# Patient Record
Sex: Female | Born: 1950 | Race: White | Hispanic: No | Marital: Single | Smoking: Never smoker
Health system: Southern US, Academic
[De-identification: ages and names within clinical notes are randomized; demographics above are authoritative.]

## PROBLEM LIST (undated history)

## (undated) DIAGNOSIS — E042 Nontoxic multinodular goiter: Secondary | ICD-10-CM

## (undated) DIAGNOSIS — I10 Essential (primary) hypertension: Secondary | ICD-10-CM

## (undated) HISTORY — DX: Essential (primary) hypertension: I10

## (undated) HISTORY — PX: HX BREAST BIOPSY: SHX20

## (undated) HISTORY — PX: PB SUPRACERV ABD HYSTERECTOMY: 58180

## (undated) HISTORY — DX: Nontoxic multinodular goiter: E04.2

---

## 1988-04-15 ENCOUNTER — Encounter (HOSPITAL_COMMUNITY): Payer: Self-pay | Admitting: Internal Medicine

## 1997-08-02 ENCOUNTER — Ambulatory Visit (INDEPENDENT_AMBULATORY_CARE_PROVIDER_SITE_OTHER): Payer: Self-pay

## 1997-09-08 ENCOUNTER — Ambulatory Visit (HOSPITAL_BASED_OUTPATIENT_CLINIC_OR_DEPARTMENT_OTHER): Payer: Self-pay

## 1997-09-21 ENCOUNTER — Ambulatory Visit (INDEPENDENT_AMBULATORY_CARE_PROVIDER_SITE_OTHER): Payer: Self-pay | Admitting: Infection Control

## 1997-09-26 ENCOUNTER — Ambulatory Visit (HOSPITAL_COMMUNITY): Payer: Self-pay

## 1997-10-05 ENCOUNTER — Inpatient Hospital Stay (HOSPITAL_COMMUNITY): Payer: Self-pay

## 1997-10-11 ENCOUNTER — Ambulatory Visit (INDEPENDENT_AMBULATORY_CARE_PROVIDER_SITE_OTHER): Payer: Self-pay

## 1997-11-17 ENCOUNTER — Ambulatory Visit (INDEPENDENT_AMBULATORY_CARE_PROVIDER_SITE_OTHER): Payer: Self-pay

## 1997-11-29 ENCOUNTER — Ambulatory Visit (INDEPENDENT_AMBULATORY_CARE_PROVIDER_SITE_OTHER): Payer: Self-pay | Admitting: Infection Control

## 1998-06-07 ENCOUNTER — Ambulatory Visit (INDEPENDENT_AMBULATORY_CARE_PROVIDER_SITE_OTHER): Payer: Self-pay | Admitting: Family Medicine

## 1998-09-14 ENCOUNTER — Ambulatory Visit (HOSPITAL_BASED_OUTPATIENT_CLINIC_OR_DEPARTMENT_OTHER): Payer: Self-pay | Admitting: Family Medicine

## 1998-10-27 ENCOUNTER — Ambulatory Visit (INDEPENDENT_AMBULATORY_CARE_PROVIDER_SITE_OTHER): Payer: Self-pay

## 1999-03-12 ENCOUNTER — Ambulatory Visit (INDEPENDENT_AMBULATORY_CARE_PROVIDER_SITE_OTHER): Payer: Self-pay

## 1999-03-21 ENCOUNTER — Ambulatory Visit (INDEPENDENT_AMBULATORY_CARE_PROVIDER_SITE_OTHER): Payer: Self-pay | Admitting: Infection Control

## 1999-03-23 ENCOUNTER — Ambulatory Visit (INDEPENDENT_AMBULATORY_CARE_PROVIDER_SITE_OTHER): Payer: Self-pay | Admitting: Infection Control

## 1999-09-17 ENCOUNTER — Ambulatory Visit (INDEPENDENT_AMBULATORY_CARE_PROVIDER_SITE_OTHER): Payer: Self-pay

## 1999-09-18 ENCOUNTER — Ambulatory Visit (INDEPENDENT_AMBULATORY_CARE_PROVIDER_SITE_OTHER): Payer: Self-pay | Admitting: Infection Control

## 1999-10-05 ENCOUNTER — Ambulatory Visit (HOSPITAL_BASED_OUTPATIENT_CLINIC_OR_DEPARTMENT_OTHER): Payer: Self-pay

## 1999-11-01 ENCOUNTER — Other Ambulatory Visit: Payer: Self-pay | Admitting: Infection Control

## 1999-11-01 ENCOUNTER — Ambulatory Visit (INDEPENDENT_AMBULATORY_CARE_PROVIDER_SITE_OTHER): Payer: Self-pay | Admitting: Infection Control

## 2000-02-15 ENCOUNTER — Ambulatory Visit (HOSPITAL_BASED_OUTPATIENT_CLINIC_OR_DEPARTMENT_OTHER): Payer: Self-pay

## 2000-03-18 ENCOUNTER — Ambulatory Visit (INDEPENDENT_AMBULATORY_CARE_PROVIDER_SITE_OTHER): Payer: Self-pay

## 2000-03-19 ENCOUNTER — Ambulatory Visit (INDEPENDENT_AMBULATORY_CARE_PROVIDER_SITE_OTHER): Payer: Self-pay | Admitting: Infection Control

## 2000-11-12 ENCOUNTER — Other Ambulatory Visit: Payer: Self-pay

## 2000-11-12 ENCOUNTER — Ambulatory Visit (HOSPITAL_BASED_OUTPATIENT_CLINIC_OR_DEPARTMENT_OTHER): Payer: Self-pay

## 2001-03-10 ENCOUNTER — Ambulatory Visit (INDEPENDENT_AMBULATORY_CARE_PROVIDER_SITE_OTHER): Payer: Self-pay | Admitting: Infection Control

## 2001-05-21 ENCOUNTER — Ambulatory Visit (INDEPENDENT_AMBULATORY_CARE_PROVIDER_SITE_OTHER): Payer: Self-pay | Admitting: Infection Control

## 2001-11-16 ENCOUNTER — Ambulatory Visit (HOSPITAL_BASED_OUTPATIENT_CLINIC_OR_DEPARTMENT_OTHER): Payer: Self-pay

## 2001-11-16 ENCOUNTER — Other Ambulatory Visit: Payer: Self-pay

## 2002-05-18 ENCOUNTER — Ambulatory Visit (INDEPENDENT_AMBULATORY_CARE_PROVIDER_SITE_OTHER): Payer: Self-pay | Admitting: Infection Control

## 2002-05-19 ENCOUNTER — Ambulatory Visit (INDEPENDENT_AMBULATORY_CARE_PROVIDER_SITE_OTHER): Payer: Self-pay | Admitting: Infection Control

## 2002-05-26 ENCOUNTER — Ambulatory Visit (INDEPENDENT_AMBULATORY_CARE_PROVIDER_SITE_OTHER): Payer: Self-pay | Admitting: Infection Control

## 2002-06-01 ENCOUNTER — Ambulatory Visit (INDEPENDENT_AMBULATORY_CARE_PROVIDER_SITE_OTHER): Payer: Self-pay | Admitting: Infection Control

## 2002-06-16 ENCOUNTER — Ambulatory Visit (INDEPENDENT_AMBULATORY_CARE_PROVIDER_SITE_OTHER): Payer: Self-pay | Admitting: Infection Control

## 2002-07-01 ENCOUNTER — Ambulatory Visit (INDEPENDENT_AMBULATORY_CARE_PROVIDER_SITE_OTHER): Payer: Self-pay | Admitting: Infection Control

## 2002-12-23 ENCOUNTER — Ambulatory Visit (INDEPENDENT_AMBULATORY_CARE_PROVIDER_SITE_OTHER): Payer: Self-pay | Admitting: Infection Control

## 2002-12-29 ENCOUNTER — Ambulatory Visit (INDEPENDENT_AMBULATORY_CARE_PROVIDER_SITE_OTHER): Payer: Self-pay | Admitting: Infection Control

## 2003-01-03 ENCOUNTER — Ambulatory Visit (INDEPENDENT_AMBULATORY_CARE_PROVIDER_SITE_OTHER): Payer: Self-pay | Admitting: Infection Control

## 2003-08-24 ENCOUNTER — Ambulatory Visit (INDEPENDENT_AMBULATORY_CARE_PROVIDER_SITE_OTHER): Payer: Self-pay | Admitting: Infection Control

## 2003-08-31 ENCOUNTER — Ambulatory Visit (INDEPENDENT_AMBULATORY_CARE_PROVIDER_SITE_OTHER): Payer: Self-pay | Admitting: Infection Control

## 2003-09-06 ENCOUNTER — Ambulatory Visit (INDEPENDENT_AMBULATORY_CARE_PROVIDER_SITE_OTHER): Payer: Self-pay | Admitting: Internal Medicine

## 2003-09-07 ENCOUNTER — Ambulatory Visit (HOSPITAL_COMMUNITY): Payer: Self-pay | Admitting: Nephrology

## 2003-10-03 ENCOUNTER — Ambulatory Visit (INDEPENDENT_AMBULATORY_CARE_PROVIDER_SITE_OTHER): Payer: Self-pay | Admitting: Internal Medicine

## 2004-01-24 ENCOUNTER — Ambulatory Visit (INDEPENDENT_AMBULATORY_CARE_PROVIDER_SITE_OTHER): Payer: Self-pay | Admitting: Internal Medicine

## 2004-04-11 ENCOUNTER — Ambulatory Visit (INDEPENDENT_AMBULATORY_CARE_PROVIDER_SITE_OTHER): Payer: Self-pay | Admitting: Infection Control

## 2005-02-20 ENCOUNTER — Ambulatory Visit (HOSPITAL_COMMUNITY): Payer: Self-pay

## 2005-04-16 ENCOUNTER — Ambulatory Visit (INDEPENDENT_AMBULATORY_CARE_PROVIDER_SITE_OTHER): Payer: Self-pay | Admitting: Infection Control

## 2005-04-17 ENCOUNTER — Ambulatory Visit (INDEPENDENT_AMBULATORY_CARE_PROVIDER_SITE_OTHER): Payer: Self-pay | Admitting: Infection Control

## 2006-02-25 ENCOUNTER — Ambulatory Visit (HOSPITAL_COMMUNITY): Payer: Self-pay | Admitting: Nuclear Medicine

## 2006-03-11 ENCOUNTER — Ambulatory Visit (HOSPITAL_COMMUNITY): Payer: Self-pay

## 2006-06-04 ENCOUNTER — Ambulatory Visit (INDEPENDENT_AMBULATORY_CARE_PROVIDER_SITE_OTHER): Payer: Self-pay | Admitting: Infection Control

## 2006-06-11 ENCOUNTER — Other Ambulatory Visit (INDEPENDENT_AMBULATORY_CARE_PROVIDER_SITE_OTHER): Payer: Self-pay | Admitting: Infection Control

## 2006-07-16 ENCOUNTER — Ambulatory Visit (HOSPITAL_COMMUNITY): Payer: Self-pay

## 2006-07-18 ENCOUNTER — Ambulatory Visit (HOSPITAL_COMMUNITY): Payer: Self-pay

## 2006-12-22 ENCOUNTER — Ambulatory Visit
Admission: RE | Admit: 2006-12-22 | Discharge: 2006-12-22 | Disposition: A | Discharge status: Home / Self Care | Payer: No Typology Code available for payment source | Attending: Infection Control | Admitting: Infection Control

## 2007-01-06 ENCOUNTER — Ambulatory Visit
Admission: RE | Admit: 2007-01-06 | Discharge: 2007-01-06 | Disposition: A | Discharge status: Home / Self Care | Payer: No Typology Code available for payment source | Attending: Infection Control | Admitting: Infection Control

## 2007-01-06 DIAGNOSIS — E559 Vitamin D deficiency, unspecified: Secondary | ICD-10-CM | POA: Insufficient documentation

## 2007-05-11 ENCOUNTER — Ambulatory Visit
Admission: RE | Admit: 2007-05-11 | Discharge: 2007-05-11 | Disposition: A | Discharge status: Home / Self Care | Payer: No Typology Code available for payment source | Attending: Infection Control | Admitting: Infection Control

## 2007-05-11 ENCOUNTER — Other Ambulatory Visit (HOSPITAL_COMMUNITY): Payer: Self-pay | Admitting: NURSE PRACTITIONER

## 2007-05-11 ENCOUNTER — Ambulatory Visit (INDEPENDENT_AMBULATORY_CARE_PROVIDER_SITE_OTHER): Payer: Self-pay | Admitting: Infection Control

## 2007-05-11 DIAGNOSIS — E568 Deficiency of other vitamins: Secondary | ICD-10-CM | POA: Insufficient documentation

## 2007-05-11 DIAGNOSIS — E039 Hypothyroidism, unspecified: Secondary | ICD-10-CM | POA: Insufficient documentation

## 2007-05-11 LAB — ELECTROLYTES
ANION GAP: 5 mmol/L (ref 5–16)
CARBON DIOXIDE: 30 mmol/L (ref 22–32)
CHLORIDE: 105 mmol/L (ref 96–111)
SODIUM: 140 mmol/L (ref 136–145)

## 2007-05-11 LAB — CALCIUM: CALCIUM: 9.6 mg/dL (ref 8.5–10.4)

## 2007-05-11 LAB — BUN
BUN/CREAT RATIO: 21 (ref 6–22)
BUN: 16 mg/dL (ref 6–20)

## 2007-05-11 LAB — CREATININE
CREATININE: 0.77 mg/dL (ref 0.49–1.10)
ESTIMATED GLOMERULAR FILTRATION RATE: >59 mL/min/{1.73_m2} (ref >59)

## 2007-05-11 LAB — PARATHYROID HORMONE (PTH): INTACT PTH: 79 pg/mL (ref 12–88)

## 2007-05-11 LAB — THYROID STIMULATING HORMONE (SENSITIVE TSH): TSH: 0.317 u[IU]/mL (ref 0.300–5.900)

## 2007-05-11 NOTE — Telephone Encounter (Signed)
Triage Queue message copied by Kelli Hope on Mon May 11, 2007 8:32 AM  ------   Message from: [place], [place]   Created: Mon May 11, 2007 7:36 AM    >> Lewayne Bunting May 11, 2007 7:36 am  Dr. Maisie Fus     The patient wants a lab slip left at front desk of med [address] for pick up. She would appreciate a call to let her know when this is completed.

## 2007-05-11 NOTE — Telephone Encounter (Signed)
Lab slip for tsh, intact pth, vit d 25oh, ca, phos at front desk, pt notified.

## 2007-05-15 NOTE — Progress Notes (Signed)
Quick Note:    Chart ordered for Dr Maisie Fus. Sjg ma  ______

## 2007-05-20 ENCOUNTER — Encounter (INDEPENDENT_AMBULATORY_CARE_PROVIDER_SITE_OTHER): Payer: No Typology Code available for payment source | Admitting: Infection Control

## 2007-05-22 NOTE — Progress Notes (Signed)
[facility] Department of Medicine  [address]  [place], [place] [address]      PROGRESS NOTE    PATIENT NAME: Penny Weber, Penny Weber  CHART NUMBER: [id number]  DATE OF BIRTH: 1950-07-22  DATE OF SERVICE: 05/20/2007    SUBJECTIVE:  The patient is a 57 year old woman here for a followup visit.  The patient is followed yearly for having a goiter.  The patient has never had any treatment done for her multinodular goiter.  We just follow it every year.  The patient has had thyroid levels checked, which have been always slightly low TSHs.  This time her TSH was 0.317.  The patient denies any symptoms of hyperthyroidism.  The patient overall says she is feeling well.    The patient also has vitamin D deficiency.  The patient currently takes one 50,000 units vitamin D capsule monthly.  The patient also had her vitamin D level checked, which was 21.  The patient also has a DEXA scan previously, which showed osteopenia.    OBJECTIVE:  The patient's height is 5 feet 2 inches.  Weight was 150.9 pounds.  Blood pressure was 140/87, pulse 68, and respirations 16.  General:  Well nourished, in no acute distress.  On neck exam:  The patient has a slightly larger left side of her thyroid gland.  Heart:  Regular rhythm.  Lungs:  Clear to auscultation.    ASSESSMENT:  [age]-year-old female with goiter and vitamin D deficiency.    PLAN:  1.  For the goiter, we will continue to keep watching it and check yearly TSH levels.  At this time, no further intervention is needed.  2.  For the patient's vitamin D deficiency, we will increase her vitamin D to two 50,000 unit capsules per month.  We will also have the patient check a vitamin D level, calcium, and albumin in three months' time, and we will also set the patient up for a DEXA scan.      Blair Dolphin, MD  Resident  [facility] Department of Medicine     See resident's note for details. I saw and evaluated the patient and agree with the resident's findings and plans as written  except as noted below.  Doreatha Martin, MD  Professor, Section of Endocrinology and [facility] Department of Medicine  .pgr    [id number]; D: 05/20/2007 13:08:65; T: 05/21/2007 07:31:49

## 2007-05-29 ENCOUNTER — Ambulatory Visit
Admission: RE | Admit: 2007-05-29 | Discharge: 2007-05-29 | Disposition: A | Discharge status: Home / Self Care | Payer: No Typology Code available for payment source | Attending: NURSE PRACTITIONER | Admitting: NURSE PRACTITIONER

## 2007-05-29 ENCOUNTER — Ambulatory Visit (HOSPITAL_BASED_OUTPATIENT_CLINIC_OR_DEPARTMENT_OTHER)
Admission: RE | Admit: 2007-05-29 | Discharge: 2007-05-29 | Disposition: A | Discharge status: Home / Self Care | Payer: No Typology Code available for payment source | Source: Ambulatory Visit | Attending: Infection Control | Admitting: Infection Control

## 2007-05-29 DIAGNOSIS — Z1231 Encounter for screening mammogram for malignant neoplasm of breast: Secondary | ICD-10-CM | POA: Insufficient documentation

## 2007-05-29 DIAGNOSIS — Z803 Family history of malignant neoplasm of breast: Secondary | ICD-10-CM | POA: Insufficient documentation

## 2007-09-25 ENCOUNTER — Ambulatory Visit
Admission: RE | Admit: 2007-09-25 | Discharge: 2007-09-25 | Disposition: A | Discharge status: Home / Self Care | Payer: No Typology Code available for payment source | Attending: Infection Control | Admitting: Infection Control

## 2007-09-25 DIAGNOSIS — E559 Vitamin D deficiency, unspecified: Secondary | ICD-10-CM | POA: Insufficient documentation

## 2007-09-25 LAB — THYROID STIMULATING HORMONE (SENSITIVE TSH): TSH: 0.406 u[IU]/mL (ref 0.300–5.900)

## 2007-09-25 LAB — T UPTAKE: THYROID UPTAKE: 37.2 % (ref 32.0–49.0)

## 2007-09-25 LAB — ALBUMIN: ALBUMIN: 3.9 g/dL (ref 3.5–4.8)

## 2007-09-25 LAB — CALCIUM: CALCIUM: 9.4 mg/dL (ref 8.5–10.4)

## 2007-10-02 LAB — VITAMIN D CALCITROL, 1,25 HYDROXYVITAMIN D: VITAMIN D, 1,25-DIHYDROXY: 47

## 2007-10-29 ENCOUNTER — Ambulatory Visit (INDEPENDENT_AMBULATORY_CARE_PROVIDER_SITE_OTHER): Payer: Self-pay | Admitting: Infection Control

## 2007-10-29 NOTE — Telephone Encounter (Signed)
Triage Queue message copied by Kelli Hope on Thu Oct 29, 2007 10:35 AM  ------   Message from: Venia Carbon   Created: Thu Oct 29, 2007 10:28 AM    >> Venia Carbon Thu Oct 29, 2007 10:28 am  DR [facility]  PT NEEDS A FORM SENT TO [employer]. IT MUST STATE THE PT'S CONDITION WAS PREEXISTING. AS OF June 30TH THE INSURANCE COMPANY HAS NOT RECEIVED THE INFORMATION. THIS IS FROM May 11, 2007. PLEASE SEND THIS TO [facility] [address] [place], [place] [address]

## 2007-11-02 NOTE — Telephone Encounter (Signed)
Forms were sent to release of information in med records.

## 2007-11-30 ENCOUNTER — Ambulatory Visit (INDEPENDENT_AMBULATORY_CARE_PROVIDER_SITE_OTHER): Payer: Self-pay | Admitting: Infection Control

## 2007-11-30 NOTE — Telephone Encounter (Signed)
Triage Queue message copied by Kelli Hope on Mon Nov 30, 2007 1:46 PM  ------   Message from: Venia Carbon   Created: Mon Nov 30, 2007 12:39 PM    >> Venia Carbon Mon Nov 30, 2007 12:39 pm  DR CHIDECKEL  PT NEEDS A FORM SENT TO HER INSURANCE CO STATING HER CONDITION WAS PREEXISTING SO THEY WILL PAY FOR LAB WORK. IT NEEDS TO BE DATED JAN 26TH. THIS IS FOR THYROID CONDITION. PLEASE FAX [employer] ATTENTION JANAINE REF # [phone]. FAX# [phone]

## 2007-12-04 NOTE — Telephone Encounter (Signed)
 Called [employer], spoke with Janaine, she will fax me the letter that needed for the patient.

## 2007-12-04 NOTE — Telephone Encounter (Signed)
Triage Queue message copied by Janifer Adie on Fri Dec 04, 2007 2:41 PM  ------   Message from: Gibson Ramp   Created: Fri Dec 04, 2007 1:51 PM    >> Gibson Ramp Fri Dec 04, 2007 1:51 pm  The pt called again, she is wanting to know if this can be taken care of today? She said that she is getting ready to be turned in to collections because of this.    >> Donnajean Lopes Nov 30, 2007 12:39 pm  DR CHIDECKEL  PT NEEDS A FORM SENT TO HER INSURANCE CO STATING HER CONDITION WAS PREEXISTING SO THEY WILL PAY FOR LAB WORK. IT NEEDS TO BE DATED JAN 26TH. THIS IS FOR THYROID CONDITION. PLEASE FAX [employer] ATTENTION JANAINE REF # [phone]. FAX# [phone]

## 2007-12-04 NOTE — Telephone Encounter (Signed)
 Forms completed and faxed to Janaine at [employer], pt notified.

## 2008-03-17 ENCOUNTER — Other Ambulatory Visit (HOSPITAL_COMMUNITY): Payer: Self-pay | Admitting: NURSE PRACTITIONER

## 2008-04-21 ENCOUNTER — Ambulatory Visit
Admission: RE | Admit: 2008-04-21 | Discharge: 2008-04-21 | Disposition: A | Discharge status: Home / Self Care | Payer: No Typology Code available for payment source | Attending: NURSE PRACTITIONER | Admitting: NURSE PRACTITIONER

## 2008-04-21 DIAGNOSIS — E559 Vitamin D deficiency, unspecified: Secondary | ICD-10-CM | POA: Insufficient documentation

## 2008-04-21 DIAGNOSIS — R55 Syncope and collapse: Secondary | ICD-10-CM | POA: Insufficient documentation

## 2008-04-21 DIAGNOSIS — I10 Essential (primary) hypertension: Secondary | ICD-10-CM | POA: Insufficient documentation

## 2008-04-21 LAB — COMPREHENSIVE METABOLIC PANEL, NON-FASTING
ALBUMIN: 4 g/dL (ref 3.5–4.8)
ALKALINE PHOSPHATASE: 104 U/L (ref 38–126)
ALT (SGPT): 20 U/L (ref 6–35)
ANION GAP: 6 mmol/L (ref 5–16)
AST (SGOT): 21 U/L (ref 5–30)
BILIRUBIN, TOTAL: 1 mg/dL (ref 0.3–1.3)
BUN/CREAT RATIO: 36 — ABNORMAL HIGH (ref 6–22)
BUN: 27 mg/dL — ABNORMAL HIGH (ref 6–20)
CALCIUM: 9.5 mg/dL (ref 8.5–10.4)
CARBON DIOXIDE: 29 mmol/L (ref 22–32)
CHLORIDE: 104 mmol/L (ref 96–111)
CREATININE: 0.75 mg/dL (ref 0.49–1.10)
ESTIMATED GLOMERULAR FILTRATION RATE: >59 ml/min/1.73m2 (ref >59)
GLUCOSE,NONFAST: 91 mg/dL (ref 65–139)
POTASSIUM: 4.5 mmol/L (ref 3.5–5.1)
SODIUM: 139 mmol/L (ref 136–145)
TOTAL PROTEIN: 6.8 g/dL (ref 6.4–8.3)

## 2008-04-21 LAB — CBC/DIFF
BASOPHILS: 1 % (ref 0–1)
BASOS ABS: 0.068 THOU/uL (ref 0.0–0.2)
EOS ABS: 0.212 THOU/uL (ref 0.1–0.3)
EOSINOPHIL: 3 % (ref 1–6)
HCT: 43.4 % (ref 33.5–45.2)
HGB: 14.8 g/dL (ref 11.5–15.2)
LYMPHOCYTES: 40 % (ref 20–45)
LYMPHS ABS: 2.62 THOU/uL (ref 1.0–4.8)
MCH: 30.1 pg (ref 27.4–33.0)
MCHC: 34.1 g/dL (ref 31.6–35.5)
MCV: 88.3 fL (ref 82.0–99.0)
MONOCYTES: 8 % (ref 4–13)
MONOS ABS: 0.541 THOU/uL (ref 0.1–0.9)
MPV: 7.5 FL (ref 7.4–10.4)
NRBC'S: 0 /100{WBCs}
PLATELET COUNT: 274 THO/UL (ref 140–450)
PMN ABS: 3.13 THOU/uL (ref 1.5–7.7)
PMN'S: 48 % (ref 40–75)
RBC: 4.91 MIL/uL (ref 3.84–5.04)
RDW: 11.4 % (ref 10.2–14.0)
WBC: 6.6 THOU/UL (ref 3.5–11.0)

## 2008-04-21 LAB — MAGNESIUM: MAGNESIUM: 2.3 mg/dL (ref 1.7–2.5)

## 2008-04-22 LAB — VITAMIN D 25 HYDROXY: 25-HYDROXY VITAMIN D: 16 — ABNORMAL LOW

## 2008-05-19 ENCOUNTER — Encounter (INDEPENDENT_AMBULATORY_CARE_PROVIDER_SITE_OTHER): Payer: No Typology Code available for payment source | Admitting: Infection Control

## 2008-05-26 ENCOUNTER — Encounter (INDEPENDENT_AMBULATORY_CARE_PROVIDER_SITE_OTHER): Payer: No Typology Code available for payment source | Admitting: Infection Control

## 2008-07-13 ENCOUNTER — Other Ambulatory Visit (HOSPITAL_COMMUNITY): Payer: Self-pay | Admitting: NURSE PRACTITIONER

## 2008-07-13 ENCOUNTER — Ambulatory Visit
Admission: RE | Admit: 2008-07-13 | Discharge: 2008-07-13 | Disposition: A | Discharge status: Home / Self Care | Payer: No Typology Code available for payment source | Attending: NURSE PRACTITIONER | Admitting: NURSE PRACTITIONER

## 2008-07-13 DIAGNOSIS — Z803 Family history of malignant neoplasm of breast: Secondary | ICD-10-CM | POA: Insufficient documentation

## 2008-07-13 DIAGNOSIS — Z1231 Encounter for screening mammogram for malignant neoplasm of breast: Secondary | ICD-10-CM | POA: Insufficient documentation

## 2008-07-19 ENCOUNTER — Ambulatory Visit
Admission: RE | Admit: 2008-07-19 | Discharge: 2008-07-19 | Disposition: A | Discharge status: Home / Self Care | Payer: No Typology Code available for payment source | Attending: NURSE PRACTITIONER | Admitting: NURSE PRACTITIONER

## 2008-07-19 DIAGNOSIS — Z853 Personal history of malignant neoplasm of breast: Secondary | ICD-10-CM | POA: Insufficient documentation

## 2008-08-18 ENCOUNTER — Ambulatory Visit
Admission: RE | Admit: 2008-08-18 | Discharge: 2008-08-18 | Disposition: A | Discharge status: Home / Self Care | Payer: No Typology Code available for payment source | Attending: Infection Control | Admitting: Infection Control

## 2008-08-18 ENCOUNTER — Ambulatory Visit (INDEPENDENT_AMBULATORY_CARE_PROVIDER_SITE_OTHER): Payer: No Typology Code available for payment source | Admitting: Infection Control

## 2008-08-18 DIAGNOSIS — E039 Hypothyroidism, unspecified: Secondary | ICD-10-CM | POA: Insufficient documentation

## 2008-08-18 DIAGNOSIS — E559 Vitamin D deficiency, unspecified: Secondary | ICD-10-CM | POA: Insufficient documentation

## 2008-08-18 LAB — THYROID STIMULATING HORMONE (SENSITIVE TSH): TSH: 0.285 u[IU]/mL — ABNORMAL LOW (ref 0.300–5.900)

## 2008-08-19 ENCOUNTER — Telehealth (INDEPENDENT_AMBULATORY_CARE_PROVIDER_SITE_OTHER): Payer: Self-pay | Admitting: Infection Control

## 2008-08-19 LAB — VITAMIN D 25 HYDROXY: 25-HYDROXY VITAMIN D: 58

## 2008-08-19 NOTE — Telephone Encounter (Signed)
I left a message for the patient to return my call.  Labs ordered.

## 2008-08-22 NOTE — Telephone Encounter (Signed)
Pt notified of labs, and advised to repeat labs in 6 months, sooner if she feels symptoms of hyperthyroidism. Lab order placed.

## 2009-05-17 ENCOUNTER — Ambulatory Visit
Admission: RE | Admit: 2009-05-17 | Discharge: 2009-05-17 | Disposition: A | Discharge status: Home / Self Care | Payer: No Typology Code available for payment source | Attending: Infection Control | Admitting: Infection Control

## 2009-05-17 DIAGNOSIS — E039 Hypothyroidism, unspecified: Secondary | ICD-10-CM | POA: Insufficient documentation

## 2009-05-17 LAB — THYROID STIMULATING HORMONE (SENSITIVE TSH): TSH: 0.367 u[IU]/mL (ref 0.300–5.900)

## 2009-05-29 ENCOUNTER — Other Ambulatory Visit (HOSPITAL_COMMUNITY): Payer: Self-pay | Admitting: NURSE PRACTITIONER

## 2009-08-17 ENCOUNTER — Encounter (INDEPENDENT_AMBULATORY_CARE_PROVIDER_SITE_OTHER): Payer: No Typology Code available for payment source | Admitting: Infection Control

## 2009-08-23 ENCOUNTER — Encounter (INDEPENDENT_AMBULATORY_CARE_PROVIDER_SITE_OTHER): Payer: No Typology Code available for payment source | Admitting: Infection Control

## 2009-09-06 ENCOUNTER — Ambulatory Visit
Admission: RE | Admit: 2009-09-06 | Discharge: 2009-09-06 | Disposition: A | Discharge status: Home / Self Care | Payer: No Typology Code available for payment source | Attending: Infection Control | Admitting: Infection Control

## 2009-09-06 ENCOUNTER — Ambulatory Visit (INDEPENDENT_AMBULATORY_CARE_PROVIDER_SITE_OTHER): Payer: No Typology Code available for payment source | Admitting: Infection Control

## 2009-09-06 DIAGNOSIS — E039 Hypothyroidism, unspecified: Secondary | ICD-10-CM | POA: Insufficient documentation

## 2009-09-06 DIAGNOSIS — M899 Disorder of bone, unspecified: Secondary | ICD-10-CM | POA: Insufficient documentation

## 2009-09-06 LAB — THYROID STIMULATING HORMONE (SENSITIVE TSH): TSH: 0.341 u[IU]/mL (ref 0.300–5.900)

## 2009-09-08 ENCOUNTER — Ambulatory Visit (HOSPITAL_BASED_OUTPATIENT_CLINIC_OR_DEPARTMENT_OTHER)
Admission: RE | Admit: 2009-09-08 | Discharge: 2009-09-08 | Disposition: A | Discharge status: Home / Self Care | Payer: No Typology Code available for payment source | Source: Ambulatory Visit | Attending: NURSE PRACTITIONER | Admitting: NURSE PRACTITIONER

## 2009-09-08 ENCOUNTER — Ambulatory Visit
Admission: RE | Admit: 2009-09-08 | Discharge: 2009-09-08 | Disposition: A | Discharge status: Home / Self Care | Payer: No Typology Code available for payment source | Attending: NURSE PRACTITIONER | Admitting: NURSE PRACTITIONER

## 2009-09-08 DIAGNOSIS — Z1231 Encounter for screening mammogram for malignant neoplasm of breast: Secondary | ICD-10-CM | POA: Insufficient documentation

## 2009-09-08 DIAGNOSIS — M899 Disorder of bone, unspecified: Secondary | ICD-10-CM | POA: Insufficient documentation

## 2009-09-08 LAB — VITAMIN D, SERUM (25 HYDROXYVITAMIN D2 AND D3 BY MS)
25 HYDROXYVITAMIN D2/D3,total: 45.6 ng/mL
25 HYDROXYVITAMIN D2: 3 ng/mL
25 HYDROXYVITAMIN D3: 42.6 ng/mL

## 2009-09-12 NOTE — Progress Notes (Signed)
[facility] Department of Medicine  [address]  [place], [place] [address]      PROGRESS NOTE    PATIENT NAME: Penny Weber, ZAFFINO [facility]  CHART NUMBER: [id number]  DATE OF BIRTH: 10/09/1950  DATE OF SERVICE: 09/06/2009    SUBJECTIVE: The patient is in excellent spirits, as usual.    OBJECTIVE: Blood pressure 120/74, pulse 55, weight 152. She is 5 feet 2-1/2 inches tall. Examination of the neck reveals a 25-gram diffuse goiter. No distinct nodules are felt. No lymph nodes are felt. The patient is presently taking 50,000 units of vitamin D a month.    ASSESSMENT:  1. Multinodular goiter, previously euthyroid.  2. Osteopenia on a DEXA scan.  3. Vitamin D deficiency, now being replaced on 50,000 units a month of vitamin D.    DISPOSITION: We got a TSH today. We got a 25-hydroxy vitamin D today to make sure she is in the right range of treatment. We kept her on the same dose of vitamin D. We will see her back in a year.      Doreatha Martin, MD  Professor, Section of Endocrinology and [facility] Department of Medicine    [id number]; D: 09/06/2009 11:39:24; T: 09/06/2009 14:43:17

## 2010-08-29 ENCOUNTER — Ambulatory Visit
Admission: RE | Admit: 2010-08-29 | Discharge: 2010-08-29 | Disposition: A | Discharge status: Home / Self Care | Payer: No Typology Code available for payment source | Source: Ambulatory Visit | Attending: NURSE PRACTITIONER | Admitting: NURSE PRACTITIONER

## 2010-08-29 DIAGNOSIS — I10 Essential (primary) hypertension: Secondary | ICD-10-CM | POA: Insufficient documentation

## 2010-08-29 DIAGNOSIS — E039 Hypothyroidism, unspecified: Secondary | ICD-10-CM | POA: Insufficient documentation

## 2010-08-29 LAB — COMPREHENSIVE METABOLIC PANEL, NON-FASTING
ALBUMIN: 4.1 g/dL (ref 3.5–4.8)
ALKALINE PHOSPHATASE: 122 U/L (ref 38–126)
ALT (SGPT): 21 U/L (ref 7–45)
ANION GAP: 5 mmol/L (ref 5–16)
AST (SGOT): 29 U/L (ref 8–41)
BILIRUBIN, TOTAL: 0.9 mg/dL (ref 0.3–1.3)
BUN/CREAT RATIO: 20 (ref 6–22)
BUN: 15 mg/dL (ref 6–20)
CALCIUM: 9.8 mg/dL (ref 8.5–10.4)
CARBON DIOXIDE: 30 mmol/L (ref 22–32)
CHLORIDE: 104 mmol/L (ref 96–111)
CREATININE: 0.74 mg/dL (ref 0.49–1.10)
ESTIMATED GLOMERULAR FILTRATION RATE: >59 ml/min/1.73m2 (ref >59)
GLUCOSE,NONFAST: 94 mg/dL (ref 65–139)
POTASSIUM: 5.4 mmol/L — ABNORMAL HIGH (ref 3.5–5.1)
SODIUM: 139 mmol/L (ref 136–145)
TOTAL PROTEIN: 7.2 g/dL (ref 6.4–8.3)

## 2010-08-29 LAB — LIPID PANEL
CHOLESTEROL: 189 mg/dL (ref <200)
HDL-CHOLESTEROL: 66 mg/dL (ref >39)
LDL (CALCULATED): 109 mg/dL — ABNORMAL HIGH (ref <100)
NON - HDL (CALCULATED): 123 mg/dL (ref <190)
TRIGLYCERIDES: 70 mg/dL (ref <150)
VLDL (CALCULATED): 14 mg/dL (ref <30)

## 2010-08-29 LAB — CBC/DIFF
BASOPHILS: 1 % (ref 0–1)
BASOS ABS: 0.058 THOU/uL (ref 0.0–0.2)
EOS ABS: 0.237 THOU/uL (ref 0.1–0.3)
EOSINOPHIL: 4 % (ref 1–6)
HCT: 45.2 % (ref 33.5–45.2)
HGB: 14.7 g/dL (ref 11.5–15.2)
LYMPHOCYTES: 38 % (ref 20–45)
LYMPHS ABS: 2.22 THOU/uL (ref 1.0–4.8)
MCH: 29.4 pg (ref 27.4–33.0)
MCHC: 32.6 g/dL (ref 31.6–35.5)
MCV: 90.2 fL (ref 82.0–99.0)
MONOCYTES: 9 % (ref 4–13)
MONOS ABS: 0.55 THOU/uL (ref 0.1–0.9)
MPV: 8.2 FL (ref 7.4–10.4)
NRBC'S: 0 /100{WBCs}
PLATELET COUNT: 233 THOU/uL (ref 140–450)
PMN ABS: 2.75 THOU/uL (ref 1.5–7.7)
PMN'S: 48 % (ref 40–75)
RBC: 5.01 MIL/uL (ref 3.84–5.04)
RDW: 12.1 % (ref 10.2–14.0)
WBC: 5.8 THOU/uL (ref 3.5–11.0)

## 2010-08-29 LAB — THYROID STIMULATING HORMONE (SENSITIVE TSH): TSH: 0.635 u[IU]/mL (ref 0.300–5.900)

## 2010-08-31 LAB — VITAMIN D, SERUM (25 HYDROXYVITAMIN D2 AND D3 BY MS)
25 HYDROXYVITAMIN D2/D3,total: 50.2 ng/mL
25 HYDROXYVITAMIN D2: 2.1 ng/mL
25 HYDROXYVITAMIN D3: 48.1 ng/mL

## 2010-09-05 ENCOUNTER — Encounter (INDEPENDENT_AMBULATORY_CARE_PROVIDER_SITE_OTHER): Payer: Self-pay | Admitting: Infection Control

## 2010-09-05 ENCOUNTER — Ambulatory Visit (INDEPENDENT_AMBULATORY_CARE_PROVIDER_SITE_OTHER): Payer: No Typology Code available for payment source | Admitting: Infection Control

## 2010-09-05 ENCOUNTER — Ambulatory Visit
Admission: RE | Admit: 2010-09-05 | Discharge: 2010-09-05 | Disposition: A | Discharge status: Home / Self Care | Payer: No Typology Code available for payment source | Source: Ambulatory Visit | Attending: Infection Control | Admitting: Infection Control

## 2010-09-05 VITALS — BP 113/76 | HR 61 | Resp 16 | Ht 62.0 in | Wt 122.0 lb

## 2010-09-05 DIAGNOSIS — E042 Nontoxic multinodular goiter: Secondary | ICD-10-CM | POA: Insufficient documentation

## 2010-09-05 DIAGNOSIS — M899 Disorder of bone, unspecified: Secondary | ICD-10-CM | POA: Insufficient documentation

## 2010-09-05 DIAGNOSIS — E876 Hypokalemia: Secondary | ICD-10-CM | POA: Insufficient documentation

## 2010-09-05 LAB — POTASSIUM: POTASSIUM: 4.1 mmol/L (ref 3.5–5.1)

## 2010-09-05 LAB — THYROID STIMULATING HORMONE (SENSITIVE TSH): TSH: 0.334 u[IU]/mL (ref 0.300–5.900)

## 2010-09-06 NOTE — Progress Notes (Signed)
[facility] Department of Medicine  [address]  [place], [place] [address]      PROGRESS NOTE    PATIENT NAME: Penny Weber, Penny Weber [facility]  CHART NUMBER: [id number]  DATE OF BIRTH: [birth year]  DATE OF SERVICE: 09/05/2010    SUBJECTIVE:  The patient returned to clinic today in her usual excellent spirits.    OBJECTIVE:  Blood pressure is 113/76, pulse is 61, weight 122, height 5 feet 2 inches.  Examination of the neck reveals a nodular feeling 20-25 gram goiter with no distinct nodules palpable and no lymph nodes.  The patient is presently taking blood pressure medicine.  She is on no thyroid pills.  She takes vitamin D with calcium because there was a question of hypovitaminosis D in the past with some osteopenia.    Laboratory data on the patient, CBC within normal limits, potassium was up at 5.4, creatinine 0.74, LDL 109, HDL 66, TSH 0.635 and vitamin D is well within normal range of 50.    ASSESSMENT:  1.  Multinodular goiter with normal thyroid function.  2.  History of hypervitaminosis D and osteopenia, now normal vitamin D levels.  3.  Hypokalemia possibly a laboratory error.  4.  Somewhat elevated LDL cholesterol with risk factors of hypertension, a family history of early heart disease.    DISPOSITION:  1.  Regarding the hyperkalemia, we asked the patient repeat the potassium today just to make sure it is not real.  2.  Thyroid wise, we will see her back in a year with another TSH.  3.  Regarding the LDL 109 with the patient's (1) strong family history of early heart disease and (2) her hypertension, I think getting the LDL well under 100 would be a reasonable goal and I would probably start her on a statin.  I told the patient to ask Dr. Otho Bellows about this.  4.  She is to come back to clinic in one year.      Doreatha Martin, MD  Professor, Section of Endocrinology and Metabolism  [facility] Department of Medicine    [id number]; D: 09/05/2010 10:59:20; T: 09/06/2010 10:15:36    cc: Lubertha Sayres MD       [facility] [address] [place], [place] [address]    Addendum: repeat K+ = 4.1.

## 2010-09-07 ENCOUNTER — Encounter: Payer: Self-pay | Admitting: NURSE PRACTITIONER

## 2011-01-03 ENCOUNTER — Other Ambulatory Visit (HOSPITAL_COMMUNITY): Payer: Self-pay | Admitting: NURSE PRACTITIONER

## 2011-01-22 ENCOUNTER — Ambulatory Visit (HOSPITAL_COMMUNITY): Payer: Self-pay

## 2011-01-23 ENCOUNTER — Ambulatory Visit (HOSPITAL_BASED_OUTPATIENT_CLINIC_OR_DEPARTMENT_OTHER)
Admission: RE | Admit: 2011-01-23 | Discharge: 2011-01-23 | Disposition: A | Discharge status: Home / Self Care | Payer: No Typology Code available for payment source | Source: Ambulatory Visit | Attending: NURSE PRACTITIONER | Admitting: NURSE PRACTITIONER

## 2011-01-23 ENCOUNTER — Other Ambulatory Visit (HOSPITAL_COMMUNITY): Payer: Self-pay | Admitting: NURSE PRACTITIONER

## 2011-01-23 ENCOUNTER — Ambulatory Visit
Admission: RE | Admit: 2011-01-23 | Discharge: 2011-01-23 | Disposition: A | Discharge status: Home / Self Care | Payer: No Typology Code available for payment source | Source: Ambulatory Visit | Attending: NURSE PRACTITIONER | Admitting: NURSE PRACTITIONER

## 2011-01-23 DIAGNOSIS — Z1231 Encounter for screening mammogram for malignant neoplasm of breast: Secondary | ICD-10-CM | POA: Insufficient documentation

## 2011-01-23 DIAGNOSIS — Z803 Family history of malignant neoplasm of breast: Secondary | ICD-10-CM | POA: Insufficient documentation

## 2011-09-09 ENCOUNTER — Encounter (INDEPENDENT_AMBULATORY_CARE_PROVIDER_SITE_OTHER): Payer: No Typology Code available for payment source | Admitting: "Endocrinology

## 2011-09-17 ENCOUNTER — Other Ambulatory Visit (HOSPITAL_COMMUNITY): Payer: Self-pay | Admitting: NURSE PRACTITIONER

## 2011-09-17 ENCOUNTER — Ambulatory Visit (INDEPENDENT_AMBULATORY_CARE_PROVIDER_SITE_OTHER): Payer: Self-pay | Admitting: "Endocrinology

## 2011-09-17 NOTE — Telephone Encounter (Signed)
Message copied by Paralee Cancel on Tue Sep 17, 2011 10:18 AM  ------       Message from: Celene Skeen D       Created: Tue Sep 17, 2011 10:15 AM       Regarding: lab orders         >> Princella Pellegrini [facility] 09/17/2011 10:15 AM       Dr. Sena Hitch,              Pt is requesting orders for blood work before her 10/04/11 appt

## 2011-09-17 NOTE — Telephone Encounter (Signed)
What labs do you want to order for patient?  Penny Weber [name], LPN 04/20/1094, 04:54 AM

## 2011-09-18 ENCOUNTER — Encounter (INDEPENDENT_AMBULATORY_CARE_PROVIDER_SITE_OTHER): Payer: No Typology Code available for payment source | Admitting: "Endocrinology

## 2011-09-25 ENCOUNTER — Encounter (INDEPENDENT_AMBULATORY_CARE_PROVIDER_SITE_OTHER): Payer: No Typology Code available for payment source | Admitting: "Endocrinology

## 2011-09-30 ENCOUNTER — Ambulatory Visit
Admission: RE | Admit: 2011-09-30 | Discharge: 2011-09-30 | Disposition: A | Discharge status: Home / Self Care | Payer: Managed Care, Other (non HMO) | Source: Ambulatory Visit | Attending: "Endocrinology | Admitting: "Endocrinology

## 2011-09-30 DIAGNOSIS — E039 Hypothyroidism, unspecified: Secondary | ICD-10-CM | POA: Insufficient documentation

## 2011-09-30 LAB — THYROID STIMULATING HORMONE WITH FREE T4 REFLEX: THYROID STIMULATING HORMONE WITH FREE T4 REFLEX: 0.407 u[IU]/mL (ref 0.300–5.900)

## 2011-10-04 ENCOUNTER — Ambulatory Visit: Payer: Managed Care, Other (non HMO) | Attending: "Endocrinology | Admitting: "Endocrinology

## 2011-10-04 ENCOUNTER — Encounter (INDEPENDENT_AMBULATORY_CARE_PROVIDER_SITE_OTHER): Payer: Self-pay | Admitting: "Endocrinology

## 2011-10-04 VITALS — BP 142/77 | HR 50 | Ht 61.18 in | Wt 127.6 lb

## 2011-10-04 DIAGNOSIS — M899 Disorder of bone, unspecified: Secondary | ICD-10-CM | POA: Insufficient documentation

## 2011-10-04 DIAGNOSIS — E042 Nontoxic multinodular goiter: Secondary | ICD-10-CM | POA: Insufficient documentation

## 2011-10-04 DIAGNOSIS — I10 Essential (primary) hypertension: Secondary | ICD-10-CM | POA: Insufficient documentation

## 2011-10-04 NOTE — Progress Notes (Signed)
Endocrinology Progress Note:    Follow up Reason(s):    1. Thyroid nodule  2.  Multinodular goiter    Subjective:  Penny Weber is a pleasant 61 y.o. female here for evaluation and management of her thyroid nodule and multinodular goiter.  Justin has been a patient of Kellie Moor, CFNP who she follows regularly. She reports she was diagnosed with thyroid nodule many years in past and that she had FNA of the right thyroid nodule in 1999 that was negative for malignancy.  Most recent ultrasound in 2004 that showed a 2.1 cm thyroid nodule on right and bilaterally enlarged thyroid.  Patient was previously followed by Dr. Maisie Fus in the past.  She reports no recent ultrasounds.      The patient reports no compressive sensation in the neck.  There is no difficulty swallowing or any choking sensation.  No change in voice or any hoarseness.  No history of trauma to the neck. No history of radiation to the neck.  No nausea or vomiting present.    She is working in public health evaluating  substance abuse.      She reports no history of fractures or falls.  She is taking vitamin d supplements.  She reports that she does not have any loose rugs and exercises regularly doing Zumba classes.    Review of Systems:   Signs/Symptoms Yes No Comments   Hair/Skin/Nail Changes  x    Compressive Symptoms  x    Fatigue  x    Weakness   x    Edema  x    Weight Changes  x    Menstrual Irregularities  x hysterectomy   Bowel Changes  x    Heat/Cold Intolerance  x    Palpitations  x    Tremors  x    Neck Tenderness  x    Increased Neck Size  x    Diplopia  x    Scleral Injection  x    Proptosis      Dry Eyes  x      No chest pain.  No shortness of breath.  No headaches.  No lightheadedness or dizziness.  No GI complaints. No diarrhea or constipation.  No nausea or vomiting.  No fever or chills.  No polyuria or polydipsia.  No change in eating patterns.  No change in sleep patterns.       All other systems negative unless otherwise noted in HPI and problem list.         Past Medical History   Diagnosis Date   . Multinodular goiter    . Hypertension    osteopenia    Past Surgical History   Procedure Date   . Pb supracerv abd hysterectomy      No Known Allergies    Family History   Problem Relation Age of Onset   . Thyroid Disease Mother      hypothyroidism   . Thyroid Cancer       no thyroid cancer in family     History     Social History   . Marital Status: Single     Spouse Name: N/A     Number of Children: N/A   . Years of Education: N/A     Social History Main Topics   . Smoking status: Never Smoker    . Smokeless tobacco: Never Used   . Alcohol Use: Not on file   . Drug Use: No   .  Sexually Active: Not on file     Other Topics Concern   . Not on file     Social History Narrative    Works at [employer] center, where she is driving frequently.     Current Outpatient Prescriptions   Medication Sig   . BISOPROLOL FUMARATE (ZEBETA ORAL) Take by mouth           Objective:      Filed Vitals:    10/04/11 0856   BP: 142/77   Pulse: 50   Height: 1.554 m (5' 1.18")   Weight: 57.9 kg (127 lb 10.3 oz)     Body mass index is 23.98 kg/(m^2).    Appearance:  Well appearing. No acute distress.  Psych:  Alert, awake, oriented x 3.    Eyes: No periorbital edema.  Hair: Normal for age.  Skin: Healthy.  No dry skin .  Nails: Healthy. No onycholysis.    Thyroid: +thyromegaly bilaterally, no nodules palpable currently  Neck: No  adenopathy.  Bruits: No audible bruit over thyroid bed.  Heart: RRR, no MRG.  Lungs: CTA.  Extremities: No tremors, palmar erythema, edema.  Reflexes: Normal.  No delayed relaxation or hyperreflexia.     Results for TAMRA, KOOS ANN (MRN [id number]) as of 10/04/2011 09:24   Ref. Range 09/30/2011 10:34   THYROID STIMULATING HORMONE WITH FREE T4 REFLEX Latest Range: 0.300-5.900 uIU/mL 0.407     Assessment/ Plan:   Penny Weber is a pleasant 62 y.o. female here for evaluation and management of thyroid nodule.      1. Thyroid nodules/ multinodular goiter  Clinically asymptomatic and euthyroid. Repeat Thyroid ultrasound to determine interval change in size of thyroid nodules.    Check TSH to determine if patient euthyroid before next visit.  Discussed with patient diagnosis of thyroid nodules and prognosis. Discussed function of thyroid and how thyroid affects metabolism and the signs/symptoms of hypothyroidism including fatigue, weight gain, edema, constipation, and depression.  Discussed with the patient that thyroid nodule may be benign growth but always risk of a cancer.  Discussed the risks and benefits of thyroid nodule fine needle aspiration.  Discussed benefits including early detection of possible cancer, establishing diagnosis, and determine what type of likely growth is present.  Discussed risks including bleeding, bruising, discomfort, and possibility of hitting neck structures.  Discussed with patient that FNA may be done under ultrasound guidance in ultrasound suite if thyroid nodule has increased in size.  Discussed need for possible surgery if the pathology results are positive for cancer or suspicious for cancer.  Patient verbalized understanding.      2.  Osteopenia  Repeat DEXA, last one done in 2011.  Currently on vitamin d supplementation.    3.  Hypertension  Patient prefers to follow up with primary care.      Discussed with patient to call in to [place] a week after lab tests and/or imaging are done if he/she has not heard from [place].  Answered all patient's questions.    Sherrine Maples, DO 10/04/2011   Assistant Professor  Section of Endocrinology and Metabolism  [facility], Department of Medicine

## 2011-11-08 ENCOUNTER — Other Ambulatory Visit (INDEPENDENT_AMBULATORY_CARE_PROVIDER_SITE_OTHER): Payer: Self-pay | Admitting: "Endocrinology

## 2011-11-08 ENCOUNTER — Ambulatory Visit
Admission: RE | Admit: 2011-11-08 | Discharge: 2011-11-08 | Disposition: A | Discharge status: Home / Self Care | Payer: Managed Care, Other (non HMO) | Source: Ambulatory Visit | Attending: "Endocrinology | Admitting: "Endocrinology

## 2011-11-08 ENCOUNTER — Ambulatory Visit (HOSPITAL_BASED_OUTPATIENT_CLINIC_OR_DEPARTMENT_OTHER)
Admission: RE | Admit: 2011-11-08 | Discharge: 2011-11-08 | Disposition: A | Discharge status: Home / Self Care | Payer: Managed Care, Other (non HMO) | Source: Ambulatory Visit | Attending: "Endocrinology | Admitting: "Endocrinology

## 2011-11-08 ENCOUNTER — Other Ambulatory Visit (INDEPENDENT_AMBULATORY_CARE_PROVIDER_SITE_OTHER): Payer: Self-pay

## 2011-11-08 DIAGNOSIS — M899 Disorder of bone, unspecified: Secondary | ICD-10-CM

## 2011-11-08 DIAGNOSIS — M949 Disorder of cartilage, unspecified: Secondary | ICD-10-CM

## 2011-11-08 DIAGNOSIS — E042 Nontoxic multinodular goiter: Secondary | ICD-10-CM | POA: Insufficient documentation

## 2011-11-11 ENCOUNTER — Other Ambulatory Visit (INDEPENDENT_AMBULATORY_CARE_PROVIDER_SITE_OTHER): Payer: Self-pay | Admitting: "Endocrinology

## 2011-11-11 NOTE — Progress Notes (Signed)
Quick Note:    Thyroid ultrasound results show:    No significant change in size.  Right mid lobe nodule 3.1 cm and not changed in size. Repeat ultrasound in 1 year.    ______

## 2011-11-11 NOTE — Progress Notes (Signed)
 Quick Note:    Patient has worsening of her osteopenia. She should be sure to eat enough calcium (1200mg  ) from the foods she eats and continue with vitamin d  supplementation. She is at risk for fractures in future but most recent FRAX score is 10.7% for major osteoportic fracture and 1.8% for hip fracture so no medical treatment indicated at this time.  ______

## 2011-11-12 ENCOUNTER — Telehealth (INDEPENDENT_AMBULATORY_CARE_PROVIDER_SITE_OTHER): Payer: Self-pay | Admitting: "Endocrinology

## 2011-11-12 NOTE — Telephone Encounter (Signed)
Left message for patient to call back into clinic  Penny Weber A Penny Harwick, LPN 04/17/7251, 66:44 AM

## 2011-11-12 NOTE — Telephone Encounter (Signed)
Message copied by Paralee Cancel on Tue Nov 12, 2011 11:01 AM  ------       Message from: Sherrine Maples       Created: Mon Nov 11, 2011  1:09 PM         Thyroid ultrasound results show:              No significant change in size.       Right mid lobe nodule 3.1 cm and not changed in size.  Repeat ultrasound in 1 year.

## 2011-11-12 NOTE — Telephone Encounter (Signed)
Message copied by Paralee Cancel on Tue Nov 12, 2011 11:01 AM  ------       Message from: Sherrine Maples       Created: Mon Nov 11, 2011  1:14 PM         Patient has worsening of her osteopenia.  She should be sure to eat enough calcium (1200mg  ) from the foods she eats and continue with vitamin d supplementation.  She is at risk for fractures in future but most recent FRAX score is 10.7% for major osteoportic fracture and 1.8% for hip fracture so no medical treatment indicated at this time.

## 2011-12-06 NOTE — Telephone Encounter (Signed)
Attempted to reach patient numerous times with no return phone call. Will mail patient a letter to inform her to call into clinic for her results  Paralee Cancel, LPN 1/61/0960, 45:40 AM

## 2012-04-24 ENCOUNTER — Ambulatory Visit
Admission: RE | Admit: 2012-04-24 | Discharge: 2012-04-24 | Disposition: A | Discharge status: Home / Self Care | Payer: Managed Care, Other (non HMO) | Source: Ambulatory Visit | Attending: NURSE PRACTITIONER | Admitting: NURSE PRACTITIONER

## 2012-04-24 DIAGNOSIS — Z1231 Encounter for screening mammogram for malignant neoplasm of breast: Secondary | ICD-10-CM | POA: Insufficient documentation

## 2012-11-09 ENCOUNTER — Other Ambulatory Visit (INDEPENDENT_AMBULATORY_CARE_PROVIDER_SITE_OTHER): Payer: Self-pay

## 2012-11-30 ENCOUNTER — Other Ambulatory Visit (INDEPENDENT_AMBULATORY_CARE_PROVIDER_SITE_OTHER): Payer: Self-pay

## 2012-12-16 ENCOUNTER — Ambulatory Visit
Admission: RE | Admit: 2012-12-16 | Discharge: 2012-12-16 | Disposition: A | Discharge status: Home / Self Care | Payer: BLUE CROSS/BLUE SHIELD | Source: Ambulatory Visit | Attending: "Endocrinology | Admitting: "Endocrinology

## 2012-12-16 DIAGNOSIS — E041 Nontoxic single thyroid nodule: Secondary | ICD-10-CM | POA: Insufficient documentation

## 2012-12-18 NOTE — Progress Notes (Signed)
 Quick Note:    No significant change in size of her thyroid  nodules. Will discuss further at next visit.  ______

## 2012-12-25 ENCOUNTER — Telehealth (INDEPENDENT_AMBULATORY_CARE_PROVIDER_SITE_OTHER): Payer: Self-pay | Admitting: "Endocrinology

## 2012-12-25 NOTE — Telephone Encounter (Signed)
Pt notified of results. Pt had no further questions.   Marguerita Beards, MA 12/25/2012, 12:11 PM

## 2012-12-25 NOTE — Telephone Encounter (Signed)
Message copied by Marguerita Beards on Fri Dec 25, 2012 12:09 PM  ------       Message from: [place], [place]       Created: Fri Dec 18, 2012  9:20 AM         No significant change in size of her thyroid nodules.  Will discuss further at next visit.  ------

## 2013-02-06 ENCOUNTER — Other Ambulatory Visit: Payer: Self-pay

## 2013-02-08 ENCOUNTER — Other Ambulatory Visit: Payer: Self-pay

## 2013-06-30 ENCOUNTER — Encounter (HOSPITAL_COMMUNITY): Payer: Self-pay

## 2014-01-14 ENCOUNTER — Other Ambulatory Visit: Payer: Self-pay

## 2015-02-04 ENCOUNTER — Other Ambulatory Visit: Payer: Self-pay

## 2015-02-07 ENCOUNTER — Other Ambulatory Visit (HOSPITAL_COMMUNITY): Payer: Self-pay | Admitting: NURSE PRACTITIONER

## 2015-02-07 DIAGNOSIS — Z1231 Encounter for screening mammogram for malignant neoplasm of breast: Secondary | ICD-10-CM

## 2016-01-27 ENCOUNTER — Other Ambulatory Visit: Payer: Self-pay

## 2016-05-14 ENCOUNTER — Ambulatory Visit: Payer: BLUE CROSS/BLUE SHIELD

## 2016-06-25 ENCOUNTER — Ambulatory Visit (HOSPITAL_BASED_OUTPATIENT_CLINIC_OR_DEPARTMENT_OTHER): Payer: Medicare Other

## 2016-06-25 ENCOUNTER — Encounter (HOSPITAL_BASED_OUTPATIENT_CLINIC_OR_DEPARTMENT_OTHER): Payer: Self-pay | Admitting: INTERNAL MEDICINE-ENDOCRINOLOGY-DIABETES AND METABOLISM

## 2016-06-25 ENCOUNTER — Ambulatory Visit
Payer: Medicare Other | Attending: INTERNAL MEDICINE-ENDOCRINOLOGY-DIABETES AND METABOLISM | Admitting: INTERNAL MEDICINE-ENDOCRINOLOGY-DIABETES AND METABOLISM

## 2016-06-25 VITALS — BP 162/75 | HR 54 | Ht 62.0 in | Wt 146.2 lb

## 2016-06-25 DIAGNOSIS — M8580 Other specified disorders of bone density and structure, unspecified site: Secondary | ICD-10-CM | POA: Insufficient documentation

## 2016-06-25 DIAGNOSIS — E0590 Thyrotoxicosis, unspecified without thyrotoxic crisis or storm: Secondary | ICD-10-CM

## 2016-06-25 DIAGNOSIS — I10 Essential (primary) hypertension: Secondary | ICD-10-CM

## 2016-06-25 DIAGNOSIS — Z90710 Acquired absence of both cervix and uterus: Secondary | ICD-10-CM | POA: Insufficient documentation

## 2016-06-25 DIAGNOSIS — M810 Age-related osteoporosis without current pathological fracture: Secondary | ICD-10-CM

## 2016-06-25 DIAGNOSIS — R251 Tremor, unspecified: Secondary | ICD-10-CM | POA: Insufficient documentation

## 2016-06-25 DIAGNOSIS — E0520 Thyrotoxicosis with toxic multinodular goiter without thyrotoxic crisis or storm: Secondary | ICD-10-CM | POA: Insufficient documentation

## 2016-06-25 DIAGNOSIS — E559 Vitamin D deficiency, unspecified: Secondary | ICD-10-CM | POA: Insufficient documentation

## 2016-06-25 DIAGNOSIS — Z79899 Other long term (current) drug therapy: Secondary | ICD-10-CM | POA: Insufficient documentation

## 2016-06-25 LAB — BASIC METABOLIC PANEL
ANION GAP: 8 mmol/L (ref 4–13)
BUN/CREA RATIO: 29 — ABNORMAL HIGH (ref 6–22)
BUN: 23 mg/dL (ref 8–25)
CALCIUM: 10.1 mg/dL (ref 8.5–10.2)
CHLORIDE: 105 mmol/L (ref 96–111)
CO2 TOTAL: 27 mmol/L (ref 22–32)
CREATININE: 0.78 mg/dL (ref 0.49–1.10)
ESTIMATED GFR: >59 mL/min/1.73mˆ2 (ref >59)
GLUCOSE: 94 mg/dL (ref 65–139)
POTASSIUM: 4.2 mmol/L (ref 3.5–5.1)
POTASSIUM: 4.2 mmol/L (ref 3.5–5.1)
SODIUM: 140 mmol/L (ref 136–145)

## 2016-06-25 LAB — T3 (TRIIODOTHYRONINE), FREE, SERUM: T3 FREE: 2.8 pg/mL (ref 1.7–3.7)

## 2016-06-25 LAB — THYROXINE, FREE (FREE T4): THYROXINE (T4), FREE: 0.96 ng/dL (ref 0.70–1.25)

## 2016-06-25 LAB — THYROID STIMULATING HORMONE (SENSITIVE TSH): TSH: 0.38 u[IU]/mL (ref 0.350–5.000)

## 2016-06-25 MED ORDER — HYDROCHLOROTHIAZIDE 25 MG TABLET
25.0000 mg | ORAL_TABLET | Freq: Every day | ORAL | 4 refills | Status: AC
Start: 2016-06-25 — End: ?

## 2016-06-25 NOTE — H&P (Signed)
[facility]  H&P     Date:06/25/2016   Historian: Patient and EHR   GENERAL DATA:   Patient Name: Penny Weber  Age: 66 y.o.  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 year old female who presents today for initial evaluation and management of toxic multi- nodular goiter previously treated with RAI therapy. Additional PMH to include Vitamin D deficiency, Low Bone Density,    From my review of EPIC, appears that she saw Dr. Maisie Fus back in 08/2008 for this.   Hx also includes FNA of right sided thyroid nodule in 1999 that was reported benign.     She has a hx of thyroid US back in 05/26/2002 reflecting:  Three thyroid nodules within the right lobe:  1) Solid nodule right upper lobe measuring 0.9 x 1.4 x 0.7 cm   2) Cystic nodule right lower lobe measuring 1 x 0.7 x 1 cm   3) Dominant nodule in right mid lobe 2.1 x 3.1 x 1.7 cm    Thyroid Uptake and Scan 2/18/20014:  24 hr RAI uptake 22.1% (within normal limits) which showed an asymmetrically enlarged right thyroid lobe w/a large area of increased radiotracer uptake. In addition, there is decreased uptake throughout the left thyroid lobe.     Repeat Thyroid US in 11/08/2011 compared to 1999/2004 showed :  Right Lobe:  1)Mid right lobe 3.1 x 2.1 x 1.7 cm, unchanged.   2) lower pole 9 x 6 x 8 mm   3) Upper pole solid 11 x 7 x 10 mm.   4) colloid cyst 5 mm   Left lobe:  3 tiny colloid cysts   There was no change in the size or appearance of the thyroid     Repeat Thyroid US 12/16/2012:  Right lobe:  1) mid to lower right lobe 3.1 x 1.4 x 1.8 cm  2) anterior mid right lobe 12.5 x 6.3 x 10 mm  3) lower pole 9 x 5 x 7.9 mm   Left lobe:  3 tiny colloid cysts     Per review of EPIC, appears her TSH has always been slightly at or below normal.     Denies neck symptoms today. No voice changes, neck  tenderness or pain, no dysphagia/odynophagia   Denies F/C/nausea/vomiting   Difficulty sleeping at night, no SOB, palpitations.   + occasional Tremors   5 lb weight gain over the past 6 months   No eye symptoms   Typically exercises daily doing Zumba     No bone fractures reported.   Last DEXA scan reportedly with low bone density s/p total hysterectomy for fibroid. Taking   1200 mg of calcium daily, 10, 000 VIt D daily, Two serving of calcium in diet      Mother had thyroid dx    MEDICATIONS :     Current Outpatient Prescriptions   Medication Sig   . BISOPROLOL FUMARATE (ZEBETA ORAL) Take by mouth     PAST MEDICAL HISTORY:     Past Medical History:   Diagnosis Date   . Hypertension    . Multinodular goiter        SURGICAL HISTORY:     Past Surgical History:   Procedure Laterality Date   . PB SUPRACERV ABD HYSTERECTOMY  ALLERGIES:   No Known Allergies     SOCIAL HISTORY:   Lives in [facility]   Single   Working for [employer]  18 years at [employer] for [employer]  Denies significant hx of smoking, ETOH, drugs     FAMILY HISTORY:     Family Medical History     Problem Relation (Age of Onset)    Bilateral breast cancer Sister    Breast Cancer Mother, Sister    Coronary Artery Disease Father    Hypertension Father    Stroke Paternal Grandmother    Thyroid Cancer Other    Thyroid Disease Mother        REVIEW OF SYSTEMS:   In addition to HPI...  Constitutional: negative  Eyes: negative  Ears, nose, mouth, throat, and face: negative  Respiratory: negative  Cardiovascular: negative  Gastrointestinal: negative  Genitourinary:negative  Integument/breast: negative  Hematologic/lymphatic: negative  Musculoskeletal:negative  Neurological: negative  Behavioral/Psych: negative  Endocrine: negative  Allergic/Immunologic: negative  All other ROS Negative    MENSTRUAL AND OBSTETRICAL HISTORY:   Total hysterectomy at the age of 66 y/o -   No children     PHYSICAL EXAM:   BP (!) 162/75  Pulse 54  Ht 1.575 m (5\' 2" )   Wt 66.3 kg (146 lb 2.6 oz)  BMI 26.73 kg/m2  General: appears stated age, no distress   Eyes:  pupils equal and round, conjunctiva clear, sclera non-icteric   HENT:  normocephalic   Neck: no adenopathy, thyroid not enlarged, symmetric, no tenderness/mass/nodules  Lungs: clear to auscultation bilaterally   Cardiovascular: regular rate and rhythm, no murmurs, radial pulses equal and regular bilaterally  Abdomen: soft, non-tender, non-distended, bowel sounds normoactive  Extremities: no cyanosis or edema  Skin: warm and dry  Neurologic: reflexes 2+ globally   Lymphatics: no submandibular, sunlingual, anterior cervical, or supraclavicular lymphadenopathy  Psychiatric:  normal affect, behavior, thought content, and speech.  IMAGING:   I have reviewed all Thyroid US since 2004 and the RAI scan    LABS:     Component      Latest Ref Rng & Units 09/30/2011 06/25/2016 06/25/2016          10:34 AM 10:58 AM 10:58 AM   SODIUM      136 - 145 mmol/L  140    POTASSIUM      3.5 - 5.1 mmol/L  4.2    CHLORIDE      96 - 111 mmol/L  105    CARBON DIOXIDE      22 - 32 mmol/L  27    ANION GAP      4 - 13 mmol/L  8    CALCIUM      8.5 - 10.2 mg/dL  16.1    GLUCOSE      65 - 139 mg/dL  94    BUN      8 - 25 mg/dL  23    CREATININE      0.49 - 1.10 mg/dL  0.96    BUN/CREAT RATIO      6 - 22  29 (H)    ESTIMATED GLOMERULAR FILTRATION RATE      >59 mL/min/1.17m^2  >59    THYROID STIMULATING HORMONE WITH FREE T4 REFLEX      0.300 - 5.900 uIU/mL 0.407     TSH      0.350 - 5.000 uIU/mL   0.380   THYROXINE, FREE (FREE T4)  0.70 - 1.25 ng/dL      FREE T3      1.7 - 3.7 pg/mL      THYROID STIMULATING IMMUNOGLOBULIN (TSI), SERUM      <0.1 IU/L      THYROPEROXIDASE (TPO) ANTIBODIES, SERUM      <=51 IU/mL        Component      Latest Ref Rng & Units 06/25/2016 06/25/2016 06/25/2016          10:58 AM 10:58 AM 10:58 AM   SODIUM      136 - 145 mmol/L      POTASSIUM      3.5 - 5.1 mmol/L      CHLORIDE      96 - 111 mmol/L      CARBON DIOXIDE      22 - 32  mmol/L      ANION GAP      4 - 13 mmol/L      CALCIUM      8.5 - 10.2 mg/dL      GLUCOSE      65 - 139 mg/dL      BUN      8 - 25 mg/dL      CREATININE      6.210.49 - 1.10 mg/dL      BUN/CREAT RATIO      6 - 22      ESTIMATED GLOMERULAR FILTRATION RATE      >59 mL/min/1.2534m^2      THYROID STIMULATING HORMONE WITH FREE T4 REFLEX      0.300 - 5.900 uIU/mL      TSH      0.350 - 5.000 uIU/mL      THYROXINE, FREE (FREE T4)      0.70 - 1.25 ng/dL 3.080.96     FREE T3      1.7 - 3.7 pg/mL  2.8    THYROID STIMULATING IMMUNOGLOBULIN (TSI), SERUM      <0.1 IU/L   <0.1   THYROPEROXIDASE (TPO) ANTIBODIES, SERUM      <=51 IU/mL        Component      Latest Ref Rng & Units 06/25/2016          10:58 AM   SODIUM      136 - 145 mmol/L    POTASSIUM      3.5 - 5.1 mmol/L    CHLORIDE      96 - 111 mmol/L    CARBON DIOXIDE      22 - 32 mmol/L    ANION GAP      4 - 13 mmol/L    CALCIUM      8.5 - 10.2 mg/dL    GLUCOSE      65 - 657139 mg/dL    BUN      8 - 25 mg/dL    CREATININE      8.460.49 - 1.10 mg/dL    BUN/CREAT RATIO      6 - 22    ESTIMATED GLOMERULAR FILTRATION RATE      >59 mL/min/1.2134m^2    THYROID STIMULATING HORMONE WITH FREE T4 REFLEX      0.300 - 5.900 uIU/mL    TSH      0.350 - 5.000 uIU/mL    THYROXINE, FREE (FREE T4)      0.70 - 1.25 ng/dL    FREE T3      1.7 - 3.7 pg/mL    THYROID STIMULATING  IMMUNOGLOBULIN (TSI), SERUM      <0.1 IU/L    THYROPEROXIDASE (TPO) ANTIBODIES, SERUM      <=51 IU/mL <10     ASSESSMENT:    Toxic Multi-Nodular Goiter, there are three nodules located within the right thyroid lobe and per RAI scan there are no cold defects hence all three nodules are hyperfunctioning/hot nodules. This is supported by a historically low TSH level. There is no indication for a biopsy of a hot/hyperfunctioning thyroid nodule. She however does report having a thyroid FNA back in 1999 which was -ve. I have no record of this for review. Thyroid ab all -ve. Will recheck TFT's today and a thyroid uptake and scan and after scan I have  discussed potentially treating her with repeat RAI vs anti-thyroid agents. Further therapy will be dependant of the results of the thyroid uptake and scan. She is mildly symptomatic with some tremors, difficulty sleeping and does have a hx of low bone density. The beta blocker she is currently on will help to control symptoms until further therapy with RAI vs anti-thyroid meds is performed.    Vit D Def, on supplementation.    Low Bone Density, taking calcium/vit D. Last DEXA 2013. Recommend f/u DEXA now.    HTN, uncontrolled today. Reportedly elevated at home as well. Will add HCTZ and ask her to f/u with Kellie Moor for further titration/addition of meds if needed      PLAN:   Labs/Imaging today:   Orders Placed This Encounter   . US THYROID   . NUC THYROID SCAN AND UPTAKE   . Tsh   . T4 Free   . T3 (TRIIODOTHYRONINE), FREE, SERUM   . THYROID STIMULATING IMMUNOGLOBULIN (TSI), SERUM   . THYROTROPIN RECEPTOR ANTIBODY, SERUM   . THYROPEROXIDASE (TPO) ANTIBODIES, SERUM   . Basic Metabolic Panel   . hydroCHLOROthiazide (HYDRODIURIL) 25 mg Oral Tablet       CC: All my notes to the patients physician, Kellie Moor, CFNP    F/u in: 3 mths, earlier if needed    I spent greater than 50% of a 50 minute visit in discussion of toxic multi-nodular goiter    Izetta Dakin, DO   Clinical Assistant Professor  Department of Endocrinology   06/25/2016  Pager: [phone]

## 2016-06-26 LAB — THYROID STIMULATING IMMUNOGLOBULIN (TSI), SERUM: THYROID STIMULATING IMMUNOGLOBULIN (TSI), SERUM: <0.1 IU/L (ref <0.1)

## 2016-06-26 LAB — THYROPEROXIDASE (TPO) ANTIBODIES, SERUM: ANTI THYROPEROXIDASE ANTIBODIES: <10 [IU]/mL (ref <=51)

## 2016-06-27 LAB — THYROTROPIN RECEPTOR ANTIBODY, SERUM: THYROTROPIN RECEPTOR ANTIBODY, SERUM: <1 IU/L (ref 0.00–1.75)

## 2016-09-23 ENCOUNTER — Ambulatory Visit
Admission: RE | Admit: 2016-09-23 | Discharge: 2016-09-23 | Disposition: A | Discharge status: Home / Self Care | Payer: Medicare Other | Source: Ambulatory Visit | Attending: INTERNAL MEDICINE-ENDOCRINOLOGY-DIABETES AND METABOLISM | Admitting: INTERNAL MEDICINE-ENDOCRINOLOGY-DIABETES AND METABOLISM

## 2016-09-23 DIAGNOSIS — E0590 Thyrotoxicosis, unspecified without thyrotoxic crisis or storm: Secondary | ICD-10-CM

## 2016-09-24 ENCOUNTER — Ambulatory Visit
Admission: RE | Admit: 2016-09-24 | Discharge: 2016-09-24 | Disposition: A | Discharge status: Still In Hospital | Payer: Medicare Other | Source: Ambulatory Visit | Attending: INTERNAL MEDICINE-ENDOCRINOLOGY-DIABETES AND METABOLISM | Admitting: INTERNAL MEDICINE-ENDOCRINOLOGY-DIABETES AND METABOLISM

## 2016-09-24 DIAGNOSIS — E0590 Thyrotoxicosis, unspecified without thyrotoxic crisis or storm: Secondary | ICD-10-CM | POA: Insufficient documentation

## 2016-09-25 ENCOUNTER — Encounter (HOSPITAL_BASED_OUTPATIENT_CLINIC_OR_DEPARTMENT_OTHER): Payer: Self-pay | Admitting: INTERNAL MEDICINE-ENDOCRINOLOGY-DIABETES AND METABOLISM

## 2016-09-25 ENCOUNTER — Ambulatory Visit (HOSPITAL_BASED_OUTPATIENT_CLINIC_OR_DEPARTMENT_OTHER): Payer: Medicare Other | Admitting: INTERNAL MEDICINE-ENDOCRINOLOGY-DIABETES AND METABOLISM

## 2016-09-25 ENCOUNTER — Ambulatory Visit
Admission: RE | Admit: 2016-09-25 | Discharge: 2016-09-25 | Disposition: A | Discharge status: Home / Self Care | Payer: Medicare Other | Source: Ambulatory Visit | Attending: INTERNAL MEDICINE-ENDOCRINOLOGY-DIABETES AND METABOLISM | Admitting: INTERNAL MEDICINE-ENDOCRINOLOGY-DIABETES AND METABOLISM

## 2016-09-25 ENCOUNTER — Ambulatory Visit (HOSPITAL_BASED_OUTPATIENT_CLINIC_OR_DEPARTMENT_OTHER): Payer: Medicare Other

## 2016-09-25 VITALS — BP 116/74 | HR 54 | Ht 62.01 in | Wt 145.3 lb

## 2016-09-25 DIAGNOSIS — E0520 Thyrotoxicosis with toxic multinodular goiter without thyrotoxic crisis or storm: Secondary | ICD-10-CM | POA: Insufficient documentation

## 2016-09-25 DIAGNOSIS — E559 Vitamin D deficiency, unspecified: Secondary | ICD-10-CM | POA: Insufficient documentation

## 2016-09-25 DIAGNOSIS — Z79899 Other long term (current) drug therapy: Secondary | ICD-10-CM | POA: Insufficient documentation

## 2016-09-25 DIAGNOSIS — E042 Nontoxic multinodular goiter: Secondary | ICD-10-CM

## 2016-09-25 DIAGNOSIS — E0590 Thyrotoxicosis, unspecified without thyrotoxic crisis or storm: Secondary | ICD-10-CM

## 2016-09-25 DIAGNOSIS — M8580 Other specified disorders of bone density and structure, unspecified site: Secondary | ICD-10-CM | POA: Insufficient documentation

## 2016-09-25 DIAGNOSIS — Z90710 Acquired absence of both cervix and uterus: Secondary | ICD-10-CM | POA: Insufficient documentation

## 2016-09-25 LAB — THYROXINE, FREE (FREE T4): THYROXINE (T4), FREE: 0.94 ng/dL (ref 0.70–1.25)

## 2016-09-25 LAB — THYROID STIMULATING HORMONE (SENSITIVE TSH): TSH: 0.542 u[IU]/mL (ref 0.350–5.000)

## 2016-09-25 LAB — T3 (TRIIODOTHYRONINE), FREE, SERUM: T3 FREE: 2.7 pg/mL (ref 1.7–3.7)

## 2016-09-29 NOTE — Progress Notes (Addendum)
Endocrinology Progress Note:    Follow up Reason(s):    1. Thyroid nodule  2.  Multinodular goiter  CC:   Toxic Multi-Nodular Goiter   HPI:   Penny Weber is a 66 y.o. year old female who presents today for initial evaluation and management of toxic multi- nodular goiter. Additional PMH to include Vitamin D deficiency, Low Bone Density,    From my review of EPIC, appears that she saw Dr. Maisie Fus back in 08/2008 for this.   Hx also includes FNA of right sided thyroid nodule in 1999 that was reported benign.     She has a hx of thyroid US back in 05/26/2002 reflecting:  Three thyroid nodules within the right lobe:  1) Solid nodule right upper lobe measuring 0.9 x 1.4 x 0.7 cm   2) Cystic nodule right lower lobe measuring 1 x 0.7 x 1 cm   3) Dominant nodule in right mid lobe 2.1 x 3.1 x 1.7 cm    Thyroid Uptake and Scan 2/18/20014:  24 hr RAI uptake 22.1% (within normal limits) which showed an asymmetrically enlarged right thyroid lobe w/a large area of increased radiotracer uptake. In addition, there is decreased uptake throughout the left thyroid lobe.     Repeat Thyroid US in 11/08/2011 compared to 1999/2004 showed :  Right Lobe:  1)Mid right lobe 3.1 x 2.1 x 1.7 cm, unchanged.   2) lower pole 9 x 6 x 8 mm   3) Upper pole solid 11 x 7 x 10 mm.   4) colloid cyst 5 mm   Left lobe:  3 tiny colloid cysts   There was no change in the size or appearance of the thyroid     Repeat Thyroid US 12/16/2012:  Right lobe:  1) mid to lower right lobe 3.1 x 1.4 x 1.8 cm  2) anterior mid right lobe 12.5 x 6.3 x 10 mm  3) lower pole 9 x 5 x 7.9 mm   Left lobe:  3 tiny colloid cysts     Thyroid Uptake and scan 09/24/16: Right hyperfunctioning thyroid adenoma. 24 hr RAIU 28.2%.     Thyroid US 09/25/16: Multiple nodules again identified throughout the R lobe with the largest at the posterior mid to lower right lobe measuring 23 mm and it was 30 mm. This appears to be the nodule that showed increased activity on the 2004 and 2018  nuclear medicine scans.     Per review of EPIC, appears her TSH has always been slightly at or below normal.     Denies neck symptoms today. No voice changes, neck tenderness or pain, no dysphagia/odynophagia   Denies F/C/nausea/vomiting   Difficulty sleeping at night, no SOB, palpitations.   + occasional Tremors   5 lb weight gain over the past 6 months   No eye symptoms   Typically exercises daily doing Zumba     No bone fractures reported.   Last DEXA scan reportedly with low bone density s/p total hysterectomy for fibroid. Taking   1200 mg of calcium daily, 10, 000 VIt D daily, Two serving of calcium in diet      Mother had thyroid dx      Review of Systems:   Signs/Symptoms Yes No Comments   Hair/Skin/Nail Changes  x    Compressive Symptoms  x    Fatigue  x    Weakness   x    Edema  x    Weight Changes  x  Menstrual Irregularities  x hysterectomy   Bowel Changes  x    Heat/Cold Intolerance  x    Palpitations  x    Tremors  x    Neck Tenderness  x    Increased Neck Size  x    Diplopia  x    Scleral Injection  x    Proptosis      Dry Eyes  x      No chest pain.  No shortness of breath.  No headaches.  No lightheadedness or dizziness.  No GI complaints. No diarrhea or constipation.  No nausea or vomiting.  No fever or chills.  No polyuria or polydipsia.  No change in eating patterns.  No change in sleep patterns.      All other systems negative unless otherwise noted in HPI and problem list.         Past Medical History   Diagnosis Date    Multinodular goiter     Hypertension    osteopenia    Past Surgical History:   Procedure Laterality Date    PB SUPRACERV ABD HYSTERECTOMY       No Known Allergies    Family History   Problem Relation Age of Onset    Thyroid Disease Mother      hypothyroidism    Thyroid Cancer       no thyroid cancer in family     Social History     Social History    Marital status: Single     Spouse name: N/A    Number of children: N/A    Years of education: N/A     Social History  Main Topics    Smoking status: Never Smoker    Smokeless tobacco: Never Used    Alcohol use Not on file    Drug use: No    Sexual activity: Not on file     Other Topics Concern    Not on file     Social History Narrative    Works at [employer], where she is driving frequently.     Current Outpatient Prescriptions   Medication Sig    BISOPROLOL FUMARATE (ZEBETA ORAL) Take by mouth    hydroCHLOROthiazide (HYDRODIURIL) 25 mg Oral Tablet Take 1 Tab (25 mg total) by mouth Once a day           Objective:      Vitals:    09/25/16 1250   BP: 116/74   Pulse: 54   Weight: 65.9 kg (145 lb 4.5 oz)   Height: 1.575 m (5' 2.01")     Body mass index is 26.57 kg/(m^2).    Appearance:  Well appearing. No acute distress.  Psych:  Alert, awake, oriented x 3.    Eyes: No periorbital edema.  Hair: Normal for age.  Skin: Healthy.  No dry skin .  Nails: Healthy. No onycholysis.    Thyroid: +thyromegaly bilaterally, no nodules palpable currently  Neck: No  adenopathy.  Bruits: No audible bruit over thyroid bed.  Heart: RRR, no MRG.  Lungs: CTA.  Extremities: No tremors, palmar erythema, edema.  Reflexes: Normal.  No delayed relaxation or hyperreflexia.     Lab Results   Component Value Date    TSH 0.542 09/25/2016    TSH 0.334 09/05/2010      Component      Latest Ref Rng & Units 06/25/2016 06/25/2016 06/25/2016          10:58 AM 10:58 AM 10:58  AM   THYROID STIMULATING IMMUNOGLOBULIN (TSI), SERUM      <0.1 IU/L <0.1     THYROTROPIN RECEPTOR ANTIBODY, SERUM      0.00 - 1.75 IU/L   <1.00   THYROPEROXIDASE (TPO) ANTIBODIES, SERUM      <=51 IU/mL  <10        IMAGING:    I have personally reviewed her Thyroid Uptake/scan from 09/24/16 and Thy [place] from 09/25/16 which show hot nodule (s) located within the R thyroid lobe.     Penny Weber  Female, 66 years old.  NUC THYROID SCAN AND UPTAKE performed on 09/24/2016 9:03 AM.  COMPARISON:  June 02, 2002.  INDICATION:  E05.90: Hyperthyroidism  TECHNIQUE:  Radionuclide: 280  microcuries of I-123    Approximately 24 hours after the oral administration of iodine-123, a  thyroid scan and radioactive iodine uptake (RAIU) calculation was  performed.  Routine images of the thyroid were obtained.    FINDINGS: There is again asymmetric enlargement of the right thyroid lobe.  There is a large nodule involving the mid to lower portion of the right  thyroid lobe which demonstrates increased tracer uptake, associated with  mild suppression of uptake within the left thyroid lobe. This was also  present on the prior study. The calculated 24-hour RAIU is 28.2%, which is  within normal limits (normal 10-30%).    IMPRESSION:  1.  Right hyperfunctioning thyroid adenoma.  2.  Normal RAIU.      US THYROID performed on 09/25/2016 10:31 AM.  REASON FOR EXAM:  E05.90: Hyperthyroidism  Ultrasound imaging of the thyroid is compared to the previous ultrasound of  December 16, 2012, and with the nuclear medicine I-123 scans of September 24, 2016 and June 02, 2002.    Today's examination shows the right lobe is 5.9 cm in length by 2.1 cm AP  by 2.3 cm transverse. Isthmus is 1.9 mm in thickness. The left lobe is 4.8  x 0.9 x 1.5 cm. The size of the thyroid is unchanged.    Multiple nodules are again identified throughout the right lobe similar to  the 2014 ultrasound.  An anterior upper lobe a solid nodule is 8.7 mm, and a small posterior  upper pole nodule is 5.4 mm.  A posterior upper pole nodule is 9 mm as seen on image 29.  In the anterior mid right lobe a nodule containing echogenic foci likely  calcifications is 12.1 x 7 x 11 mm, image 43, and is stable as it was 12.5  x 6.3 x 10 mm.  In the anterior lower pole a cyst nodule is 7.7 mm, image 56 and it was 9.9  mm.  In the posterior mid and lower right lobe a dominant nodule is 23.2 x 16.4  x 13.7 mm as seen on image 92. Previously it measured 30.7 x 13.8 x 17.8 mm  as seen on the prior study image 30.    The 2 prior nuclear medicine scan showed a  hyperactive nodule within the  mid to lower right lobe.    In the left lobe a tiny colloid cyst at the upper pole is 3 mm and a second  at the midportion is 2.2 mm. At the lower pole a similar colloid nodule is  3.2 mm.      IMPRESSION:  No change in the size nor appearance of the thyroid.    Multiple nodules are again identified throughout the right lobe as  discussed above with  the largest at the posterior mid to lower right lobe  measuring 23 mm and it was 30 mm. This appears to be the nodule that showed  increased activity on the 2004 and 2018 nuclear medicine scans. Please  correlate with the clinical data. If there are persistent concerns one  could consider fine-needle aspiration.    Assessment/ Plan:  Penny Weber is a pleasant 66 y.o. female here for evaluation and management of multiple thyroid nodules with R sided thyroid nodules with are hyperfunctioning..      1. Thyroid nodules/TN vs multinodular goiter  Clinically asymptomatic and euthyroid with borderline low TSH. Suspect a toxic adenoma / multinodular goiter with pronounced uptake R lobe with low normal TSH and have dicussed the treatments for this. Recommend tx due to hx of low bone density.     Discussed risks/benefits for MMI, RAI therapy vs surgery (not recommended). Patient wishes to proceed with RAI therapy.     Will check TFT's today and then every 1 month for 3 months following RAI therapy. Standing lab order placed for her today.    Largest nodules located within the left lobe are tiny colloid cysts 3 mm in largest dimension which do not meet criteria for biopsy.    2.  Osteopenia  Repeat DEXA, last one done in 2013.  Continue on vitamin d supplementation.  Have discussed that treating hyperthyroidism will be beneficial for bone disease     Discussed with patient to call in to [place] a week after lab tests and/or imaging are done if he/she has not heard from [place].  Answered all patient's questions.    Penny Dakin, DO   Assistant  Professor  Section of Endocrinology and Metabolism  [facility], Department of Medicine

## 2016-09-30 NOTE — Addendum Note (Signed)
Addended by: Izetta DakinGIORDANO, Joee Iovine on: 09/30/2016 07:38 AM     Modules accepted: Orders

## 2016-10-24 ENCOUNTER — Ambulatory Visit
Admission: RE | Admit: 2016-10-24 | Discharge: 2016-10-24 | Disposition: A | Discharge status: Home / Self Care | Payer: Medicare Other | Source: Ambulatory Visit | Attending: INTERNAL MEDICINE-ENDOCRINOLOGY-DIABETES AND METABOLISM | Admitting: INTERNAL MEDICINE-ENDOCRINOLOGY-DIABETES AND METABOLISM

## 2016-10-24 DIAGNOSIS — E0520 Thyrotoxicosis with toxic multinodular goiter without thyrotoxic crisis or storm: Secondary | ICD-10-CM | POA: Insufficient documentation

## 2016-10-29 ENCOUNTER — Telehealth (HOSPITAL_BASED_OUTPATIENT_CLINIC_OR_DEPARTMENT_OTHER): Payer: Self-pay | Admitting: INTERNAL MEDICINE-ENDOCRINOLOGY-DIABETES AND METABOLISM

## 2016-10-29 NOTE — Telephone Encounter (Signed)
-----   Message from Izetta DakinJennifer Giordano, DO sent at 10/28/2016 12:10 PM EDT -----  Dear Ms. Adelson,  Now that you have had radioactive therapy for your overactive thyroid, please get thyroid labs performed in 4 weeks time. The labs are already ordered in the system. Please call with questions or concerns. Sincerely, Dr. GReece Agar

## 2016-10-29 NOTE — Telephone Encounter (Signed)
Left message on Vm to return CLL to clinic. Kobe Ofallon, MA  10/29/2016, 16:07

## 2016-12-02 ENCOUNTER — Ambulatory Visit: Payer: Medicare Other | Attending: INTERNAL MEDICINE-ENDOCRINOLOGY-DIABETES AND METABOLISM

## 2016-12-02 DIAGNOSIS — E0520 Thyrotoxicosis with toxic multinodular goiter without thyrotoxic crisis or storm: Secondary | ICD-10-CM | POA: Insufficient documentation

## 2016-12-02 LAB — THYROXINE, FREE (FREE T4): THYROXINE (T4), FREE: 0.9 ng/dL (ref 0.70–1.25)

## 2016-12-02 LAB — THYROID STIMULATING HORMONE (SENSITIVE TSH): TSH: 1.385 u[IU]/mL (ref 0.350–5.000)

## 2016-12-30 ENCOUNTER — Encounter (HOSPITAL_BASED_OUTPATIENT_CLINIC_OR_DEPARTMENT_OTHER): Payer: Self-pay | Admitting: INTERNAL MEDICINE-ENDOCRINOLOGY-DIABETES AND METABOLISM

## 2017-01-07 ENCOUNTER — Ambulatory Visit (HOSPITAL_BASED_OUTPATIENT_CLINIC_OR_DEPARTMENT_OTHER): Payer: Medicare Other

## 2017-01-07 ENCOUNTER — Ambulatory Visit
Payer: Medicare Other | Attending: INTERNAL MEDICINE-ENDOCRINOLOGY-DIABETES AND METABOLISM | Admitting: INTERNAL MEDICINE-ENDOCRINOLOGY-DIABETES AND METABOLISM

## 2017-01-07 ENCOUNTER — Encounter (HOSPITAL_BASED_OUTPATIENT_CLINIC_OR_DEPARTMENT_OTHER): Payer: Self-pay | Admitting: INTERNAL MEDICINE-ENDOCRINOLOGY-DIABETES AND METABOLISM

## 2017-01-07 VITALS — BP 110/70 | HR 65 | Ht 62.0 in | Wt 145.7 lb

## 2017-01-07 DIAGNOSIS — E0520 Thyrotoxicosis with toxic multinodular goiter without thyrotoxic crisis or storm: Secondary | ICD-10-CM

## 2017-01-07 DIAGNOSIS — Z79899 Other long term (current) drug therapy: Secondary | ICD-10-CM | POA: Insufficient documentation

## 2017-01-07 DIAGNOSIS — M8580 Other specified disorders of bone density and structure, unspecified site: Secondary | ICD-10-CM | POA: Insufficient documentation

## 2017-01-07 DIAGNOSIS — E042 Nontoxic multinodular goiter: Secondary | ICD-10-CM

## 2017-01-07 DIAGNOSIS — E559 Vitamin D deficiency, unspecified: Secondary | ICD-10-CM | POA: Insufficient documentation

## 2017-01-07 DIAGNOSIS — Z8349 Family history of other endocrine, nutritional and metabolic diseases: Secondary | ICD-10-CM | POA: Insufficient documentation

## 2017-01-07 DIAGNOSIS — I10 Essential (primary) hypertension: Secondary | ICD-10-CM | POA: Insufficient documentation

## 2017-01-07 DIAGNOSIS — Z923 Personal history of irradiation: Secondary | ICD-10-CM | POA: Insufficient documentation

## 2017-01-07 LAB — THYROID STIMULATING HORMONE (SENSITIVE TSH): TSH: 14.598 u[IU]/mL — ABNORMAL HIGH (ref 0.350–5.000)

## 2017-01-07 LAB — THYROXINE, FREE (FREE T4): THYROXINE (T4), FREE: 0.83 ng/dL (ref 0.70–1.25)

## 2017-01-08 NOTE — Progress Notes (Signed)
Endocrinology Progress Note:    Follow up Reason(s):    1. Thyroid nodule  2.  Multinodular goiter  CC:   Toxic Multi-Nodular Goiter   HPI:   Tunisia Landgrebe is a 66 y.o.  female who presents today for f/u evaluation and management of toxic multi- nodular goiter. Additional PMH to include Vitamin D deficiency, Low Bone Density    From review of EPIC, appears that she saw Dr. Maisie Fus back in 08/2008 for this.   Hx also includes FNA of right sided thyroid nodule in 1999 that was reported benign.     She has a hx of thyroid US back in 05/26/2002 reflecting:  Three thyroid nodules within the right lobe:  1) Solid nodule right upper lobe measuring 0.9 x 1.4 x 0.7 cm   2) Cystic nodule right lower lobe measuring 1 x 0.7 x 1 cm   3) Dominant nodule in right mid lobe 2.1 x 3.1 x 1.7 cm    Thyroid Uptake and Scan 2/18/20014:  24 hr RAI uptake 22.1% (within normal limits) which showed an asymmetrically enlarged right thyroid lobe w/a large area of increased radiotracer uptake. In addition, there is decreased uptake throughout the left thyroid lobe.     Thyroid Uptake and scan 09/24/16: Right hyperfunctioning thyroid adenoma. 24 hr RAIU 28.2%.     Thyroid US 09/25/16: Multiple nodules again identified throughout the R lobe with the largest at the posterior mid to lower right lobe measuring 23 mm and it was 30 mm. This appears to be the nodule that showed increased activity on the 2004 and 2018 nuclear medicine scans.     Per review of EPIC, appears her TSH has always been slightly at or below normal.   On October 24, 2016 she was treated with radioactive iodine therapy 20.5 mCi  Lab work performed approximately 1 month after therapy reflected a TSH of 1.385 and a free T4 level of 0.90.   Labs were performed December 02, 2016.    Today she returns for follow-up  Denies neck symptoms today. No voice changes, neck tenderness or pain, no dysphagia/odynophagia   No dry mouth  Weight stable  Feels less anxious and more even keel  since radioactive iodine therapy    No bone fractures reported.   Last DEXA scan reportedly with low bone density s/p total hysterectomy for fibroid. Taking   1200 mg of calcium daily, 10, 000 VIt D daily, Two serving of calcium in diet      Mother had thyroid dx      Review of Systems:   Signs/Symptoms Yes No Comments   Hair/Skin/Nail Changes  x    Compressive Symptoms  x    Fatigue  x    Weakness   x    Edema  x    Weight Changes  x    Menstrual Irregularities  x hysterectomy   Bowel Changes  x    Heat/Cold Intolerance  x    Palpitations  x    Tremors  x    Neck Tenderness  x    Increased Neck Size  x    Diplopia  x    Scleral Injection  x    Proptosis      Dry Eyes  x      No chest pain.  No shortness of breath.  No headaches.  No lightheadedness or dizziness.  No GI complaints. No diarrhea or constipation.  No nausea or vomiting.  No fever or chills.  No polyuria or polydipsia.  No change in eating patterns.  No change in sleep patterns.      All other systems negative unless otherwise noted in HPI and problem list.         Past Medical History   Diagnosis Date   . Multinodular goiter    . Hypertension    osteopenia    Past Surgical History:   Procedure Laterality Date   . PB SUPRACERV ABD HYSTERECTOMY       No Known Allergies    Family History   Problem Relation Age of Onset   . Thyroid Disease Mother      hypothyroidism   . Thyroid Cancer       no thyroid cancer in family     Social History     Social History   . Marital status: Single     Spouse name: N/A   . Number of children: N/A   . Years of education: N/A     Social History Main Topics   . Smoking status: Never Smoker   . Smokeless tobacco: Never Used   . Alcohol use Not on file   . Drug use: No   . Sexual activity: Not on file     Other Topics Concern   . Not on file     Social History Narrative    Works at [employer], where she is driving frequently.     Current Outpatient Prescriptions   Medication Sig   . BISOPROLOL FUMARATE (ZEBETA  ORAL) Take by mouth   . hydroCHLOROthiazide (HYDRODIURIL) 25 mg Oral Tablet Take 1 Tab (25 mg total) by mouth Once a day           Objective:      Vitals:    01/07/17 1528   BP: 110/70   Pulse: 65   Weight: 66.1 kg (145 lb 11.6 oz)   Height: 1.575 m ( )   BMI: 26.71     Body mass index is 26.65 kg/(m^2).    Appearance:  Well appearing. No acute distress.  Psych:  Alert, awake, oriented x 3.    Eyes: No periorbital edema.  Hair: Normal for age.  Skin: Healthy.  No dry skin .  Nails: Healthy. No onycholysis.    Thyroid: +thyromegaly bilaterally, no nodules palpable currently  Neck: No  adenopathy.  Bruits: No audible bruit over thyroid bed.  Heart: RRR, no MRG.  Lungs: CTA.  Extremities: No tremors, palmar erythema, edema.  Reflexes: Normal.  No delayed relaxation or hyperreflexia.     Lab Results   Component Value Date    TSH 14.598 (H) 01/07/2017    TSH 0.334 09/05/2010      Component      Latest Ref Rng & Units 06/25/2016 06/25/2016 06/25/2016          10:58 AM 10:58 AM 10:58 AM   THYROID STIMULATING IMMUNOGLOBULIN (TSI), SERUM      <0.1 IU/L <0.1     THYROTROPIN RECEPTOR ANTIBODY, SERUM      0.00 - 1.75 IU/L   <1.00   THYROPEROXIDASE (TPO) ANTIBODIES, SERUM      <=51 IU/mL  <10      Component      Latest Ref Rng & Units 09/25/2016 09/25/2016 09/25/2016 12/02/2016           1:30 PM  1:30 PM  1:30 PM 11:45 AM   TSH      0.350 - 5.000 uIU/mL 0.542  1.385   THYROXINE, FREE (FREE T4)      0.70 - 1.25 ng/dL  9.81     FREE T3      1.7 - 3.7 pg/mL   2.7      Component      Latest Ref Rng & Units 12/02/2016 01/07/2017 01/07/2017          11:45 AM  4:16 PM  4:16 PM   TSH      0.350 - 5.000 uIU/mL  14.598 (H)    THYROXINE, FREE (FREE T4)      0.70 - 1.25 ng/dL 1.91  4.78   FREE T3      1.7 - 3.7 pg/mL        IMAGING:    I have personally reviewed her Thyroid Uptake/scan from 09/24/16 and Thy [place] from 09/25/16 which show hot nodule (s) located within the R thyroid lobe.     Kynnedy ANN [facility]  Female, 66 years old.  NUC THYROID SCAN  AND UPTAKE performed on 09/24/2016 9:03 AM.  COMPARISON:  June 02, 2002.  INDICATION:  E05.90: Hyperthyroidism  TECHNIQUE:  Radionuclide: 280 microcuries of I-123    Approximately 24 hours after the oral administration of iodine-123, a  thyroid scan and radioactive iodine uptake (RAIU) calculation was  performed.  Routine images of the thyroid were obtained.    FINDINGS: There is again asymmetric enlargement of the right thyroid lobe.  There is a large nodule involving the mid to lower portion of the right  thyroid lobe which demonstrates increased tracer uptake, associated with  mild suppression of uptake within the left thyroid lobe. This was also  present on the prior study. The calculated 24-hour RAIU is 28.2%, which is  within normal limits (normal 10-30%).    IMPRESSION:  1.  Right hyperfunctioning thyroid adenoma.  2.  Normal RAIU.      US THYROID performed on 09/25/2016 10:31 AM.  REASON FOR EXAM:  E05.90: Hyperthyroidism  Ultrasound imaging of the thyroid is compared to the previous ultrasound of  December 16, 2012, and with the nuclear medicine I-123 scans of September 24, 2016 and June 02, 2002.    Today's examination shows the right lobe is 5.9 cm in length by 2.1 cm AP  by 2.3 cm transverse. Isthmus is 1.9 mm in thickness. The left lobe is 4.8  x 0.9 x 1.5 cm. The size of the thyroid is unchanged.    Multiple nodules are again identified throughout the right lobe similar to  the 2014 ultrasound.  An anterior upper lobe a solid nodule is 8.7 mm, and a small posterior  upper pole nodule is 5.4 mm.  A posterior upper pole nodule is 9 mm as seen on image 29.  In the anterior mid right lobe a nodule containing echogenic foci likely  calcifications is 12.1 x 7 x 11 mm, image 43, and is stable as it was 12.5  x 6.3 x 10 mm.  In the anterior lower pole a cyst nodule is 7.7 mm, image 56 and it was 9.9  mm.  In the posterior mid and lower right lobe a dominant nodule is 23.2 x 16.4  x 13.7 mm as seen on  image 92. Previously it measured 30.7 x 13.8 x 17.8 mm  as seen on the prior study image 30.    The 2 prior nuclear medicine scan showed a hyperactive nodule within the  mid to lower right lobe.    In the left  lobe a tiny colloid cyst at the upper pole is 3 mm and a second  at the midportion is 2.2 mm. At the lower pole a similar colloid nodule is  3.2 mm.      IMPRESSION:  No change in the size nor appearance of the thyroid.    Multiple nodules are again identified throughout the right lobe as  discussed above with the largest at the posterior mid to lower right lobe  measuring 23 mm and it was 30 mm. This appears to be the nodule that showed  increased activity on the 2004 and 2018 nuclear medicine scans. Please  correlate with the clinical data. If there are persistent concerns one  could consider fine-needle aspiration.    Assessment/ Plan:  Myha Arizpe is a pleasant 66 y.o. female here for evaluation and management of multiple thyroid nodules with R sided thyroid nodules with are hyperfunctioning..      1. Toxic multinodular goiter   Recommend tx due to hx of low bone density and risks of CV disease and subclinical hyperthyroidism  Status post radioactive iodine therapy October 24, 2016  Thyroid function test 1 month following radioactive iodine ablation are within the normal ranges  We will assess thyroid function tests monthly for the next 6 months.   I have placed a standing lab order in epic for her to have labs done monthly    2.  Osteopenia  Repeat DEXA, last one done in 2013.  Continue on vitamin d supplementation.  Have discussed that treating hyperthyroidism will be beneficial for bone disease     Discussed with patient to call in to [place] a week after lab tests and/or imaging are done if he/she has not heard from [place].  Answered all patient's questions.    Izetta Dakin, DO   Assistant Professor  Section of Endocrinology and Metabolism  [facility], Department of Medicine

## 2017-01-21 ENCOUNTER — Other Ambulatory Visit (HOSPITAL_BASED_OUTPATIENT_CLINIC_OR_DEPARTMENT_OTHER): Payer: Self-pay | Admitting: INTERNAL MEDICINE-ENDOCRINOLOGY-DIABETES AND METABOLISM

## 2017-01-21 DIAGNOSIS — E890 Postprocedural hypothyroidism: Secondary | ICD-10-CM

## 2017-02-01 ENCOUNTER — Other Ambulatory Visit: Payer: Self-pay

## 2017-02-07 ENCOUNTER — Ambulatory Visit: Payer: Medicare Other | Attending: INTERNAL MEDICINE-ENDOCRINOLOGY-DIABETES AND METABOLISM

## 2017-02-07 DIAGNOSIS — E890 Postprocedural hypothyroidism: Secondary | ICD-10-CM | POA: Insufficient documentation

## 2017-02-07 LAB — THYROID STIMULATING HORMONE (SENSITIVE TSH): TSH: 29.661 u[IU]/mL — ABNORMAL HIGH (ref 0.350–5.000)

## 2017-02-07 LAB — THYROXINE, FREE (FREE T4): THYROXINE (T4), FREE: 0.47 ng/dL — ABNORMAL LOW (ref 0.70–1.25)

## 2017-02-08 ENCOUNTER — Other Ambulatory Visit (HOSPITAL_BASED_OUTPATIENT_CLINIC_OR_DEPARTMENT_OTHER): Payer: Self-pay | Admitting: INTERNAL MEDICINE-ENDOCRINOLOGY-DIABETES AND METABOLISM

## 2017-02-08 ENCOUNTER — Encounter (HOSPITAL_BASED_OUTPATIENT_CLINIC_OR_DEPARTMENT_OTHER): Payer: Self-pay | Admitting: INTERNAL MEDICINE-ENDOCRINOLOGY-DIABETES AND METABOLISM

## 2017-02-08 DIAGNOSIS — E890 Postprocedural hypothyroidism: Secondary | ICD-10-CM

## 2017-02-08 MED ORDER — LEVOTHYROXINE 112 MCG TABLET: 112 ug | Tab | Freq: Every morning | ORAL | 4 refills | 0 days | Status: DC

## 2017-03-11 ENCOUNTER — Ambulatory Visit: Payer: Medicare Other | Attending: INTERNAL MEDICINE-ENDOCRINOLOGY-DIABETES AND METABOLISM

## 2017-03-11 ENCOUNTER — Other Ambulatory Visit (HOSPITAL_BASED_OUTPATIENT_CLINIC_OR_DEPARTMENT_OTHER): Payer: Self-pay | Admitting: INTERNAL MEDICINE-ENDOCRINOLOGY-DIABETES AND METABOLISM

## 2017-03-11 DIAGNOSIS — E890 Postprocedural hypothyroidism: Secondary | ICD-10-CM

## 2017-03-11 LAB — THYROID STIMULATING HORMONE (SENSITIVE TSH): TSH: 0.366 u[IU]/mL (ref 0.350–5.000)

## 2017-03-11 LAB — THYROXINE, FREE (FREE T4): THYROXINE (T4), FREE: 1.51 ng/dL — ABNORMAL HIGH (ref 0.70–1.25)

## 2017-04-28 LAB — ENTER/EDIT EXTERNAL COMMON LAB RESULTS
THYROXINE, FREE: 1.63
TSH: 0.513

## 2017-05-15 ENCOUNTER — Telehealth (HOSPITAL_BASED_OUTPATIENT_CLINIC_OR_DEPARTMENT_OTHER): Payer: Self-pay | Admitting: INTERNAL MEDICINE-ENDOCRINOLOGY-DIABETES AND METABOLISM

## 2017-05-15 NOTE — Telephone Encounter (Signed)
Received labs. Placed in provider box for review.  Emogene MorganLisa Sharlee Rufino, MA  05/15/2017, 16:11

## 2017-05-23 ENCOUNTER — Other Ambulatory Visit (HOSPITAL_BASED_OUTPATIENT_CLINIC_OR_DEPARTMENT_OTHER): Payer: Self-pay | Admitting: INTERNAL MEDICINE-ENDOCRINOLOGY-DIABETES AND METABOLISM

## 2017-05-23 DIAGNOSIS — E039 Hypothyroidism, unspecified: Secondary | ICD-10-CM

## 2017-05-28 ENCOUNTER — Telehealth (HOSPITAL_BASED_OUTPATIENT_CLINIC_OR_DEPARTMENT_OTHER): Payer: Self-pay | Admitting: INTERNAL MEDICINE-ENDOCRINOLOGY-DIABETES AND METABOLISM

## 2017-05-28 NOTE — Telephone Encounter (Signed)
-----   Message from Penny DakinJennifer Giordano, Penny Weber sent at 05/23/2017  8:02 PM EST -----  Dear Penny Weber,  Your thyroid labs are improved however you need slightly less hormone. I recommend reducing the dose to take Levothyroxine 112 mcg Monday through Saturday. Please take only 1/2 of a tablet on Sundays. Please get repeat labs in 8 weeks time to assess this decreased dose. I have ordered labs for you today. Please call with questions or concerns. Sincerely, Penny Weber

## 2017-05-28 NOTE — Telephone Encounter (Signed)
Called pt, no answer, left message.Dallie DadVanessa Nashon Erbes, MA  05/28/2017, 09:55

## 2017-05-28 NOTE — Telephone Encounter (Signed)
Called pt, relayed message below, pt stated understanding with no questions.Dallie DadVanessa Duan Scharnhorst, MA  05/28/2017, 10:36

## 2017-06-09 NOTE — Telephone Encounter (Signed)
Spoke with Dr. Idamae LusherGiordano, states patient is taking 112 mcg Mon- Sat. Per Dr. Idamae LusherGiordano, advised to take one pill Mon - Fri, and 1/2 tablet on Sat. Recheck labs in 6-8 weeks. Patient verbalizes understanding. Amalia HaileyAshley Nicole Addylin Manke, RN  06/09/2017, 15:22

## 2017-07-07 ENCOUNTER — Ambulatory Visit
Payer: Medicare Other | Attending: INTERNAL MEDICINE-ENDOCRINOLOGY-DIABETES AND METABOLISM | Admitting: INTERNAL MEDICINE-ENDOCRINOLOGY-DIABETES AND METABOLISM

## 2017-07-07 ENCOUNTER — Ambulatory Visit (HOSPITAL_BASED_OUTPATIENT_CLINIC_OR_DEPARTMENT_OTHER): Payer: Medicare Other

## 2017-07-07 ENCOUNTER — Encounter (HOSPITAL_BASED_OUTPATIENT_CLINIC_OR_DEPARTMENT_OTHER): Payer: Self-pay | Admitting: INTERNAL MEDICINE-ENDOCRINOLOGY-DIABETES AND METABOLISM

## 2017-07-07 VITALS — BP 122/76 | HR 60 | Ht 62.0 in | Wt 145.7 lb

## 2017-07-07 DIAGNOSIS — E0520 Thyrotoxicosis with toxic multinodular goiter without thyrotoxic crisis or storm: Secondary | ICD-10-CM | POA: Insufficient documentation

## 2017-07-07 DIAGNOSIS — E039 Hypothyroidism, unspecified: Secondary | ICD-10-CM

## 2017-07-07 DIAGNOSIS — M8580 Other specified disorders of bone density and structure, unspecified site: Secondary | ICD-10-CM | POA: Insufficient documentation

## 2017-07-07 DIAGNOSIS — E011 Iodine-deficiency related multinodular (endemic) goiter: Secondary | ICD-10-CM

## 2017-07-07 DIAGNOSIS — E559 Vitamin D deficiency, unspecified: Secondary | ICD-10-CM

## 2017-07-07 DIAGNOSIS — Z79890 Hormone replacement therapy (postmenopausal): Secondary | ICD-10-CM | POA: Insufficient documentation

## 2017-07-07 DIAGNOSIS — Z8349 Family history of other endocrine, nutritional and metabolic diseases: Secondary | ICD-10-CM | POA: Insufficient documentation

## 2017-07-07 DIAGNOSIS — Z923 Personal history of irradiation: Secondary | ICD-10-CM | POA: Insufficient documentation

## 2017-07-07 DIAGNOSIS — Z79899 Other long term (current) drug therapy: Secondary | ICD-10-CM | POA: Insufficient documentation

## 2017-07-07 DIAGNOSIS — I10 Essential (primary) hypertension: Secondary | ICD-10-CM | POA: Insufficient documentation

## 2017-07-07 DIAGNOSIS — E890 Postprocedural hypothyroidism: Secondary | ICD-10-CM

## 2017-07-07 LAB — THYROXINE, FREE (FREE T4): THYROXINE (T4), FREE: 1.12 ng/dL (ref 0.70–1.25)

## 2017-07-07 LAB — THYROID STIMULATING HORMONE (SENSITIVE TSH): TSH: 1.989 u[IU]/mL (ref 0.350–5.000)

## 2017-07-07 NOTE — Progress Notes (Signed)
Endocrinology Progress Note:    Follow up Reason(s):    1. Thyroid nodule  2.  Multinodular goiter  3. osteopenia  4. Vit d Def    HPI:   Penny Weber is a 67 y.o.  female who presents today for f/u evaluation and management of toxic multi- nodular goiter. Additional PMH to include Vitamin D deficiency, Low Bone Density    From review of EPIC, appears that she saw Dr. Maisie Fus back in 08/2008 for this.   Hx also includes FNA of right sided thyroid nodule in 1999 that was reported benign.     She has a hx of thyroid US back in 05/26/2002 reflecting:  Three thyroid nodules within the right lobe:  1) Solid nodule right upper lobe measuring 0.9 x 1.4 x 0.7 cm   2) Cystic nodule right lower lobe measuring 1 x 0.7 x 1 cm   3) Dominant nodule in right mid lobe 2.1 x 3.1 x 1.7 cm    Thyroid Uptake and Scan 2/18/20014:  24 hr RAI uptake 22.1% (within normal limits) which showed an asymmetrically enlarged right thyroid lobe w/a large area of increased radiotracer uptake. In addition, there is decreased uptake throughout the left thyroid lobe.     Thyroid Uptake and scan 09/24/16: Right hyperfunctioning thyroid adenoma. 24 hr RAIU 28.2%.     Thyroid US 09/25/16: Multiple nodules again identified throughout the R lobe with the largest at the posterior mid to lower right lobe measuring 23 mm and it was 30 mm. This appears to be the nodule that showed increased activity on the 2004 and 2018 nuclear medicine scans.     Per review of EPIC, appears her TSH has always been slightly at or below normal.   On October 24, 2016 she was treated with radioactive iodine therapy 20.5 mCi  Lab work performed approximately 1 month after RAI therapy reflected a TSH of 1.385 and a free T4 level of 0.90.   Labs were performed December 02, 2016.  Repeat thyroid labs were performed October of 2018 showing a free T4 of 0.47 and a TSH of 29.661.   She was initiated on thyroid hormone 112 mcg daily at that time.  She continues to take levothyroxine  112 mcg daily Monday through Friday she takes a half a pill on Saturdays and skips the dose on Sunday.    Denies neck symptoms today.   No voice changes, neck tenderness or pain, no dysphagia/odynophagia   No chest pain or shortness of breath or palpitations does have some slight tremors  No dry mouth  Weight stable  Feels less anxious and more even keel since radioactive iodine therapy    No bone fractures reported.   Last DEXA scan reportedly with low bone density s/p total hysterectomy for fibroid. Taking   1200 mg of calcium daily,5, 000 VIt D daily  Her PCP has retired and she will be transitioning to a new PCP    Mother had thyroid dx      Review of Systems:   Signs/Symptoms Yes No Comments   Hair/Skin/Nail Changes  x    Compressive Symptoms  x    Fatigue  x    Weakness   x    Edema  x    Weight Changes  x    Menstrual Irregularities  x hysterectomy   Bowel Changes  x    Heat/Cold Intolerance  x    Palpitations  x    Tremors x  Neck Tenderness  x    Increased Neck Size  x    Diplopia  x    Scleral Injection  x    Proptosis      Dry Eyes  x      No chest pain.  No shortness of breath.  No headaches.  No lightheadedness or dizziness.  No GI complaints. No diarrhea or constipation.  No nausea or vomiting.  No fever or chills.  No polyuria or polydipsia.  No change in eating patterns.  No change in sleep patterns.      All other systems negative unless otherwise noted in HPI and problem list.         Past Medical History   Diagnosis Date   . Multinodular goiter    . Hypertension    osteopenia    Past Surgical History:   Procedure Laterality Date   . PB SUPRACERV ABD HYSTERECTOMY       No Known Allergies    Family History   Problem Relation Age of Onset   . Thyroid Disease Mother      hypothyroidism   . Thyroid Cancer       no thyroid cancer in family     Social History     Socioeconomic History   . Marital status: Single     Spouse name: Not on file   . Number of children: Not on file   . Years of education:  Not on file   . Highest education level: Not on file   Occupational History   . Not on file   Social Needs   . Financial resource strain: Not on file   . Food insecurity:     Worry: Not on file     Inability: Not on file   . Transportation needs:     Medical: Not on file     Non-medical: Not on file   Tobacco Use   . Smoking status: Never Smoker   . Smokeless tobacco: Never Used   Substance and Sexual Activity   . Alcohol use: Not on file   . Drug use: No   . Sexual activity: Not on file   Lifestyle   . Physical activity:     Days per week: Not on file     Minutes per session: Not on file   . Stress: Not on file   Relationships   . Social connections:     Talks on phone: Not on file     Gets together: Not on file     Attends religious service: Not on file     Active member of club or organization: Not on file     Attends meetings of clubs or organizations: Not on file     Relationship status: Not on file   . Intimate partner violence:     Fear of current or ex partner: Not on file     Emotionally abused: Not on file     Physically abused: Not on file     Forced sexual activity: Not on file   Other Topics Concern   . Not on file   Social History Narrative    Works at [employer], where she is driving frequently.     Current Outpatient Medications   Medication Sig   . BISOPROLOL FUMARATE (ZEBETA ORAL) Take by mouth   . hydroCHLOROthiazide (HYDRODIURIL) 25 mg Oral Tablet Take 1 Tab (25 mg total) by mouth Once a day   .  levothyroxine (SYNTHROID) 112 mcg Oral Tablet Take 1 Tab (112 mcg total) by mouth Every morning           Objective:    BP 122/76   Pulse 60   Ht 1.575 m (5\' 2" )   Wt 66.1 kg (145 lb 11.6 oz)   BMI 26.65 kg/m      Body mass index is 26.65 kg/m.    Appearance:  Well appearing. No acute distress.  Psych:  Alert, awake, oriented x 3.    Eyes: No periorbital edema.  Hair: Normal for age.  Skin: Healthy.  No dry skin .  Nails: Healthy. No onycholysis.    Thyroid: +thyromegaly bilaterally,  no nodules palpable currently  Neck: No  adenopathy.  Bruits: No audible bruit over thyroid bed.  Heart: RRR, no MRG.  Lungs: CTA.  Extremities: No tremors, palmar erythema, edema.  Reflexes: Normal.  No delayed relaxation or hyperreflexia.       Last Value    TSH   Date Value Ref Range Status   07/07/2017 1.989 0.350 - 5.000 uIU/mL Final          IMAGING:    I have personally reviewed her Thyroid Uptake/scan from 09/24/16 and Thy [place] from 09/25/16 which show hot nodule (s) located within the R thyroid lobe.     Penny Weber  Female, 67 years old.  NUC THYROID SCAN AND UPTAKE performed on 09/24/2016 9:03 AM.  COMPARISON:  June 02, 2002.  INDICATION:  E05.90: Hyperthyroidism  TECHNIQUE:  Radionuclide: 280 microcuries of I-123    Approximately 24 hours after the oral administration of iodine-123, a  thyroid scan and radioactive iodine uptake (RAIU) calculation was  performed.  Routine images of the thyroid were obtained.    FINDINGS: There is again asymmetric enlargement of the right thyroid lobe.  There is a large nodule involving the mid to lower portion of the right  thyroid lobe which demonstrates increased tracer uptake, associated with  mild suppression of uptake within the left thyroid lobe. This was also  present on the prior study. The calculated 24-hour RAIU is 28.2%, which is  within normal limits (normal 10-30%).    IMPRESSION:  1.  Right hyperfunctioning thyroid adenoma.  2.  Normal RAIU.      US THYROID performed on 09/25/2016 10:31 AM.  REASON FOR EXAM:  E05.90: Hyperthyroidism  Ultrasound imaging of the thyroid is compared to the previous ultrasound of  December 16, 2012, and with the nuclear medicine I-123 scans of September 24, 2016 and June 02, 2002.    Today's examination shows the right lobe is 5.9 cm in length by 2.1 cm AP  by 2.3 cm transverse. Isthmus is 1.9 mm in thickness. The left lobe is 4.8  x 0.9 x 1.5 cm. The size of the thyroid is unchanged.    Multiple nodules are again  identified throughout the right lobe similar to  the 2014 ultrasound.  An anterior upper lobe a solid nodule is 8.7 mm, and a small posterior  upper pole nodule is 5.4 mm.  A posterior upper pole nodule is 9 mm as seen on image 29.  In the anterior mid right lobe a nodule containing echogenic foci likely  calcifications is 12.1 x 7 x 11 mm, image 43, and is stable as it was 12.5  x 6.3 x 10 mm.  In the anterior lower pole a cyst nodule is 7.7 mm, image 56 and it was 9.9  mm.  In the posterior mid and lower right lobe a dominant nodule is 23.2 x 16.4  x 13.7 mm as seen on image 92. Previously it measured 30.7 x 13.8 x 17.8 mm  as seen on the prior study image 30.    The 2 prior nuclear medicine scan showed a hyperactive nodule within the  mid to lower right lobe.    In the left lobe a tiny colloid cyst at the upper pole is 3 mm and a second  at the midportion is 2.2 mm. At the lower pole a similar colloid nodule is  3.2 mm.      IMPRESSION:  No change in the size nor appearance of the thyroid.    Multiple nodules are again identified throughout the right lobe as  discussed above with the largest at the posterior mid to lower right lobe  measuring 23 mm and it was 30 mm. This appears to be the nodule that showed  increased activity on the 2004 and 2018 nuclear medicine scans. Please  correlate with the clinical data. If there are persistent concerns one  could consider fine-needle aspiration.    Assessment/ Plan:  Penny Weber is a pleasant 67 y.o. female here for evaluation and management of multiple thyroid nodules with R sided thyroid nodules with are hyperfunctioning..      1. 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  Osteopenia  Repeat DEXA, last one done in 2013.  Continue on vitamin d supplementation.  Have discussed that treating hyperthyroidism will be beneficial for bone disease   Close f/u with PCP for bone disease  Continue Calcium daily   Recommend repeat dexa if not recently done    3. Vit D deficiency  Continue 5000 IU daily   Check d today     Orders Placed This Encounter   . VITAMIN D 25, TOTAL   . Tsh   . T4 Free         Discussed with patient to call in to us a week after lab tests and/or imaging are done if he/she has not heard from us.  Answered all patient's questions.    Izetta DakinJennifer Mirka Barbone, DO   Assistant Professor  Section of Endocrinology and Metabolism  [facility] [place], Department of Medicine

## 2017-07-08 ENCOUNTER — Encounter (HOSPITAL_BASED_OUTPATIENT_CLINIC_OR_DEPARTMENT_OTHER): Payer: Self-pay | Admitting: INTERNAL MEDICINE-ENDOCRINOLOGY-DIABETES AND METABOLISM

## 2017-07-08 LAB — VITAMIN D 25, TOTAL: VITAMIN D, 25OH: 40 ng/mL (ref 30–100)

## 2017-07-11 ENCOUNTER — Telehealth (HOSPITAL_BASED_OUTPATIENT_CLINIC_OR_DEPARTMENT_OTHER): Payer: Self-pay | Admitting: INTERNAL MEDICINE-ENDOCRINOLOGY-DIABETES AND METABOLISM

## 2017-07-11 NOTE — Telephone Encounter (Signed)
-----   Message from Izetta DakinJennifer Giordano, DO sent at 07/08/2017  3:08 PM EDT -----  Dear Ms. Rando,  Your vitamin D level is perfect! Sincerely, DrG

## 2017-07-11 NOTE — Telephone Encounter (Signed)
Called patient.  No answer.  Left message advising of MyChart message and to call the office with questions.  Emogene MorganLisa Ura Hausen, MA  07/11/2017, 11:52

## 2017-07-14 NOTE — Telephone Encounter (Signed)
Called pt, relayed message below, pt stated understanding with no questions.Dallie DadVanessa Abdurahman Rugg, MA  07/14/2017, 10:13

## 2017-09-19 ENCOUNTER — Ambulatory Visit: Payer: Medicare Other | Attending: INTERNAL MEDICINE-ENDOCRINOLOGY-DIABETES AND METABOLISM

## 2017-09-19 DIAGNOSIS — E890 Postprocedural hypothyroidism: Principal | ICD-10-CM | POA: Insufficient documentation

## 2017-09-19 LAB — THYROXINE, FREE (FREE T4): THYROXINE (T4), FREE: 1.44 ng/dL — ABNORMAL HIGH (ref 0.70–1.25)

## 2017-09-19 LAB — THYROID STIMULATING HORMONE (SENSITIVE TSH): TSH: 0.544 u[IU]/mL (ref 0.350–5.000)

## 2017-09-21 ENCOUNTER — Other Ambulatory Visit (HOSPITAL_BASED_OUTPATIENT_CLINIC_OR_DEPARTMENT_OTHER): Payer: Self-pay | Admitting: INTERNAL MEDICINE-ENDOCRINOLOGY-DIABETES AND METABOLISM

## 2017-09-21 DIAGNOSIS — E039 Hypothyroidism, unspecified: Secondary | ICD-10-CM

## 2017-09-21 MED ORDER — LEVOTHYROXINE 75 MCG TABLET: 75 ug | Tab | Freq: Every morning | ORAL | 4 refills | 0 days | Status: AC

## 2017-09-22 ENCOUNTER — Telehealth (HOSPITAL_BASED_OUTPATIENT_CLINIC_OR_DEPARTMENT_OTHER): Payer: Self-pay

## 2017-09-22 NOTE — Telephone Encounter (Signed)
Spoke with patient regarding message from provider. Patient verbalized understanding and denies any question. Keturah ShaversShannon D Hughes  09/22/2017, 11:43

## 2017-09-22 NOTE — Telephone Encounter (Signed)
-----   Message from Izetta DakinJennifer Giordano, DO sent at 09/21/2017  6:50 PM EDT -----  Dear Ms Rayna SextonMangone,  You need slightly less thyroid hormone. I will write for a smaller tablet 75 mcg daily,please take this each day. Please get repeat thyroid labs in 8 weeks time. Please call with questions or concerns. Sincerely, Dr. Idamae LusherGiordano

## 2017-11-11 ENCOUNTER — Ambulatory Visit: Payer: Medicare Other | Attending: INTERNAL MEDICINE-ENDOCRINOLOGY-DIABETES AND METABOLISM

## 2017-11-11 DIAGNOSIS — E876 Hypokalemia: Secondary | ICD-10-CM | POA: Insufficient documentation

## 2017-11-11 DIAGNOSIS — E039 Hypothyroidism, unspecified: Secondary | ICD-10-CM

## 2017-11-11 DIAGNOSIS — E890 Postprocedural hypothyroidism: Secondary | ICD-10-CM | POA: Insufficient documentation

## 2017-11-11 LAB — THYROID STIMULATING HORMONE (SENSITIVE TSH): TSH: 1.095 u[IU]/mL (ref 0.350–5.000)

## 2017-11-11 LAB — POTASSIUM: POTASSIUM: 4.2 mmol/L (ref 3.5–5.1)

## 2017-11-11 LAB — THYROXINE, FREE (FREE T4): THYROXINE (T4), FREE: 1.13 ng/dL (ref 0.70–1.25)

## 2017-11-12 ENCOUNTER — Telehealth (HOSPITAL_BASED_OUTPATIENT_CLINIC_OR_DEPARTMENT_OTHER): Payer: Self-pay | Admitting: INTERNAL MEDICINE-ENDOCRINOLOGY-DIABETES AND METABOLISM

## 2017-11-12 NOTE — Telephone Encounter (Signed)
-----   Message from Penny DakinJennifer Giordano, DO sent at 11/11/2017  3:57 PM EDT -----  Dear Ms Penny SextonMangone,  I am so happy to report that your thyroid labs are excellent and in the normal ranges. Please continue the same dose of levothyroxine 75 mcg daily. Please call with questions or concerns. Sincerely, Dr. Idamae LusherGiordano

## 2017-11-12 NOTE — Telephone Encounter (Signed)
Spoke with patient regarding provider's below message. Patient verbalized understanding. Tami RibasAshley N Martin, RN  11/12/2017, 11:57

## 2018-01-06 ENCOUNTER — Ambulatory Visit: Payer: Medicare Other | Attending: INTERNAL MEDICINE-ENDOCRINOLOGY-DIABETES AND METABOLISM

## 2018-01-06 DIAGNOSIS — E890 Postprocedural hypothyroidism: Principal | ICD-10-CM | POA: Insufficient documentation

## 2018-01-06 LAB — THYROXINE, FREE (FREE T4): THYROXINE (T4), FREE: 1.18 ng/dL (ref 0.70–1.25)

## 2018-01-07 ENCOUNTER — Encounter (HOSPITAL_BASED_OUTPATIENT_CLINIC_OR_DEPARTMENT_OTHER): Payer: Self-pay | Admitting: INTERNAL MEDICINE-ENDOCRINOLOGY-DIABETES AND METABOLISM

## 2018-01-07 ENCOUNTER — Ambulatory Visit
Payer: Medicare Other | Attending: INTERNAL MEDICINE-ENDOCRINOLOGY-DIABETES AND METABOLISM | Admitting: INTERNAL MEDICINE-ENDOCRINOLOGY-DIABETES AND METABOLISM

## 2018-01-07 VITALS — BP 122/78 | HR 71 | Ht 62.05 in | Wt 149.3 lb

## 2018-01-07 DIAGNOSIS — Z79890 Hormone replacement therapy (postmenopausal): Secondary | ICD-10-CM | POA: Insufficient documentation

## 2018-01-07 DIAGNOSIS — Z923 Personal history of irradiation: Secondary | ICD-10-CM | POA: Insufficient documentation

## 2018-01-07 DIAGNOSIS — E890 Postprocedural hypothyroidism: Secondary | ICD-10-CM | POA: Insufficient documentation

## 2018-01-07 DIAGNOSIS — E559 Vitamin D deficiency, unspecified: Secondary | ICD-10-CM | POA: Insufficient documentation

## 2018-01-07 DIAGNOSIS — M8580 Other specified disorders of bone density and structure, unspecified site: Secondary | ICD-10-CM | POA: Insufficient documentation

## 2018-01-07 DIAGNOSIS — Z79899 Other long term (current) drug therapy: Secondary | ICD-10-CM | POA: Insufficient documentation

## 2018-01-07 DIAGNOSIS — E042 Nontoxic multinodular goiter: Secondary | ICD-10-CM

## 2018-01-07 NOTE — Progress Notes (Signed)
Endocrinology Progress Note:    Follow up Reason(s):    1. Thyroid nodule  2.  Multinodular goiter  CC:   Toxic Multi-Nodular Goiter   HPI:   Penny Weber is a 67 y.o.  female who presents today for f/u evaluation and management of toxic multi- nodular goiter. Additional PMH to include Vitamin D deficiency, Low Bone Density    From review of EPIC, appears that she saw Dr. Maisie Fus back in 08/2008 for this.   Hx also includes FNA of right sided thyroid nodule in 1999 that was reported benign.     She has a hx of thyroid US back in 05/26/2002 reflecting:  Three thyroid nodules within the right lobe:  1) Solid nodule right upper lobe measuring 0.9 x 1.4 x 0.7 cm   2) Cystic nodule right lower lobe measuring 1 x 0.7 x 1 cm   3) Dominant nodule in right mid lobe 2.1 x 3.1 x 1.7 cm    Thyroid Uptake and Scan 2/18/20014:  24 hr RAI uptake 22.1% (within normal limits) which showed an asymmetrically enlarged right thyroid lobe w/a large area of increased radiotracer uptake. In addition, there is decreased uptake throughout the left thyroid lobe.     Thyroid Uptake and scan 09/24/16: Right hyperfunctioning thyroid adenoma. 24 hr RAIU 28.2%.     Thyroid US 09/25/16: Multiple nodules again identified throughout the R lobe with the largest at the posterior mid to lower right lobe measuring 23 mm and it was 30 mm. This appears to be the nodule that showed increased activity on the 2004 and 2018 nuclear medicine scans.     Per review of EPIC, appears her TSH has always been slightly at or below normal.   On October 24, 2016 she was treated with radioactive iodine therapy 20.5 mCi  Lab work performed approximately 1 month after therapy reflected a TSH of 1.385 and a free T4 level of 0.90.   Labs were performed December 02, 2016.    Today she returns for follow-up  Denies any neck symptoms including difficulty swallowing, dysphagia, change in vocal tone. Denies symptoms of hyperthyroidism including heat intolerance, weight loss,  diarrhea, sweating. Denies symptoms of hypothyroidism cold intolerance, chills, constipation, and weight gain.    No bone fractures reported.   Last DEXA scan reportedly with low bone density s/p total hysterectomy for fibroid. Taking   1200 mg of calcium daily, 2,000 IU VIt D daily, Two serving of calcium in diet      Mother had thyroid dx      Review of Systems:   Signs/Symptoms Yes No Comments   Hair/Skin/Nail Changes  x    Compressive Symptoms  x    Fatigue  x    Weakness   x    Edema  x    Weight Changes  x    Menstrual Irregularities  x hysterectomy   Bowel Changes  x    Heat/Cold Intolerance  x    Palpitations  x    Tremors  x    Neck Tenderness  x    Increased Neck Size  x    Diplopia  x    Scleral Injection  x    Proptosis      Dry Eyes  x      No chest pain.  No shortness of breath.  No headaches.  No lightheadedness or dizziness.  No GI complaints. No diarrhea or constipation.  No nausea or vomiting.  No fever or  chills.  No polyuria or polydipsia.  No change in eating patterns.  No change in sleep patterns.      All other systems negative unless otherwise noted in HPI and problem list.         Past Medical History   Diagnosis Date   . Multinodular goiter    . Hypertension    osteopenia    Past Surgical History:   Procedure Laterality Date   . HX BREAST BIOPSY     . PB SUPRACERV ABD HYSTERECTOMY       No Known Allergies    Family History   Problem Relation Age of Onset   . Thyroid Disease Mother      hypothyroidism   . Thyroid Cancer       no thyroid cancer in family     Social History     Socioeconomic History   . Marital status: Single     Spouse name: Not on file   . Number of children: Not on file   . Years of education: Not on file   . Highest education level: Not on file   Tobacco Use   . Smoking status: Never Smoker   . Smokeless tobacco: Never Used   Substance and Sexual Activity   . Drug use: No   Social History Narrative    Works at [employer], where she is driving frequently.       Current Outpatient Medications   Medication Sig   . hydroCHLOROthiazide (HYDRODIURIL) 25 mg Oral Tablet Take 1 Tab (25 mg total) by mouth Once a day   . levothyroxine (SYNTHROID) 75 mcg Oral Tablet Take 1 Tab (75 mcg total) by mouth Every morning       ROS: No cp or nausea. All other systems were reviewed and negative except for as above.      Objective:      Vitals:    01/07/17 1528   BP: 110/70   Pulse: 65   Weight: 66.1 kg (145 lb 11.6 oz)   Height: 1.575 m (5\' 2" )   BMI: 26.71     Body mass index is 27.26 kg/m.    Appearance:  Well appearing. No acute distress.  Psych:  Alert, awake, oriented x 3.    Eyes: No periorbital edema.  Hair: Normal for age.  Skin: Healthy.  No dry skin .  Nails: Healthy. No onycholysis.    Thyroid: thyroid atrophy without nodularity, no nodules palpable currently  Neck: No  adenopathy.  Bruits: No audible bruit over thyroid bed.  Heart: RRR, no MRG.  Lungs: CTA.  Extremities: No tremors, palmar erythema, edema.  Reflexes: Normal.  No delayed relaxation or hyperreflexia.         Labs:  Lab Results   Component Value Date    TSH 1.095 11/11/2017    TSH 0.513 04/28/2017      Component      Latest Ref Rng & Units 06/25/2016 06/25/2016 06/25/2016          10:58 AM 10:58 AM 10:58 AM   THYROID STIMULATING IMMUNOGLOBULIN (TSI), SERUM      <0.1 IU/L <0.1     THYROTROPIN RECEPTOR ANTIBODY, SERUM      0.00 - 1.75 IU/L   <1.00   THYROPEROXIDASE (TPO) ANTIBODIES, SERUM      <=51 IU/mL  <10      Component      Latest Ref Rng & Units 09/25/2016 09/25/2016 09/25/2016 12/02/2016  1:30 PM  1:30 PM  1:30 PM 11:45 AM   TSH      0.350 - 5.000 uIU/mL 0.542   1.385   THYROXINE, FREE (FREE T4)      0.70 - 1.25 ng/dL  1.61     FREE T3      1.7 - 3.7 pg/mL   2.7      Component      Latest Ref Rng & Units 12/02/2016 01/07/2017 01/07/2017          11:45 AM  4:16 PM  4:16 PM   TSH      0.350 - 5.000 uIU/mL  14.598 (H)    THYROXINE, FREE (FREE T4)      0.70 - 1.25 ng/dL 0.96  0.45   FREE T3      1.7 - 3.7 pg/mL         IMAGING:    I have personally reviewed her Thyroid Uptake/scan from 09/24/16 and Thy [place] from 09/25/16 which show hot nodule (s) located within the R thyroid lobe.     Danasia ANN [facility]  Female, 67 years old.  NUC THYROID SCAN AND UPTAKE performed on 09/24/2016 9:03 AM.  COMPARISON:  June 02, 2002.  INDICATION:  E05.90: Hyperthyroidism  TECHNIQUE:  Radionuclide: 280 microcuries of I-123    Approximately 24 hours after the oral administration of iodine-123, a  thyroid scan and radioactive iodine uptake (RAIU) calculation was  performed.  Routine images of the thyroid were obtained.    FINDINGS: There is again asymmetric enlargement of the right thyroid lobe.  There is a large nodule involving the mid to lower portion of the right  thyroid lobe which demonstrates increased tracer uptake, associated with  mild suppression of uptake within the left thyroid lobe. This was also  present on the prior study. The calculated 24-hour RAIU is 28.2%, which is  within normal limits (normal 10-30%).    IMPRESSION:  1.  Right hyperfunctioning thyroid adenoma.  2.  Normal RAIU.      US THYROID performed on 09/25/2016 10:31 AM.  REASON FOR EXAM:  E05.90: Hyperthyroidism  Ultrasound imaging of the thyroid is compared to the previous ultrasound of  December 16, 2012, and with the nuclear medicine I-123 scans of September 24, 2016 and June 02, 2002.    Today's examination shows the right lobe is 5.9 cm in length by 2.1 cm AP  by 2.3 cm transverse. Isthmus is 1.9 mm in thickness. The left lobe is 4.8  x 0.9 x 1.5 cm. The size of the thyroid is unchanged.    Multiple nodules are again identified throughout the right lobe similar to  the 2014 ultrasound.  An anterior upper lobe a solid nodule is 8.7 mm, and a small posterior  upper pole nodule is 5.4 mm.  A posterior upper pole nodule is 9 mm as seen on image 29.  In the anterior mid right lobe a nodule containing echogenic foci likely  calcifications is 12.1 x 7 x 11 mm, image  43, and is stable as it was 12.5  x 6.3 x 10 mm.  In the anterior lower pole a cyst nodule is 7.7 mm, image 56 and it was 9.9  mm.  In the posterior mid and lower right lobe a dominant nodule is 23.2 x 16.4  x 13.7 mm as seen on image 92. Previously it measured 30.7 x 13.8 x 17.8 mm  as seen on the prior study image 30.  The 2 prior nuclear medicine scan showed a hyperactive nodule within the  mid to lower right lobe.    In the left lobe a tiny colloid cyst at the upper pole is 3 mm and a second  at the midportion is 2.2 mm. At the lower pole a similar colloid nodule is  3.2 mm.      IMPRESSION:  No change in the size nor appearance of the thyroid.    Multiple nodules are again identified throughout the right lobe as  discussed above with the largest at the posterior mid to lower right lobe  measuring 23 mm and it was 30 mm. This appears to be the nodule that showed  increased activity on the 2004 and 2018 nuclear medicine scans. Please  correlate with the clinical data. If there are persistent concerns one  could consider fine-needle aspiration.    Assessment/ Plan:  Scarlette Hogston is a pleasant 67 y.o. female here for evaluation and management of multiple thyroid nodules with R sided thyroid nodules which are hyperfunctioning. She is now s/p radioiodine therapy and adjusting dose of LT4.    1. Toxic multinodular goiter, post-ablative hypothyroidism   Recommend tx due to hx of low bone density and risks of CV disease and subclinical hyperthyroidism  Status post radioactive iodine therapy October 24, 2016  Thyroid function test 1 month following radioactive iodine ablation are within the normal ranges  Currently on 75 mcg LT4. Free T4 WNL. Order placed to have TSH checked in 6 mths    2.  Osteopenia  Patient states she is due for DEXA in 2020 per insurance requirements.  Continue on vitamin d supplementation and calcium.    Discussed with patient to call in to [place] a week after lab tests and/or imaging are  done if he/she has not heard from [place].  Answered all patient's questions.    Return to clinic in 1 week.    Chad Cordial, DO  01/07/2018, 16:00      I saw and examined the patient.  I reviewed the resident's note.  I agree with the findings and plan of care as documented in the resident's note.  Any exceptions/additions are edited/noted.    Edsel Petrin, MD

## 2018-11-25 ENCOUNTER — Encounter (HOSPITAL_COMMUNITY): Payer: Self-pay

## 2018-11-26 ENCOUNTER — Ambulatory Visit (HOSPITAL_BASED_OUTPATIENT_CLINIC_OR_DEPARTMENT_OTHER): Payer: Self-pay | Admitting: INTERNAL MEDICINE-ENDOCRINOLOGY-DIABETES AND METABOLISM

## 2018-11-26 ENCOUNTER — Encounter (HOSPITAL_COMMUNITY): Payer: Self-pay

## 2018-11-26 NOTE — Telephone Encounter (Signed)
Faxed lab orders to requested fax number, received confirmation. Landyn Buckalew, RN  11/26/2018, 10:27

## 2018-11-26 NOTE — Telephone Encounter (Signed)
-----   Message from [place] sent at 11/26/2018 10:08 AM EDT -----  Dr. Maryjo Rochester    Pt calling requesting lab orders be sent to the following.     Fax - [phone]

## 2018-12-12 ENCOUNTER — Other Ambulatory Visit: Payer: Self-pay

## 2018-12-14 ENCOUNTER — Other Ambulatory Visit (HOSPITAL_BASED_OUTPATIENT_CLINIC_OR_DEPARTMENT_OTHER): Payer: Self-pay | Admitting: INTERNAL MEDICINE-ENDOCRINOLOGY-DIABETES AND METABOLISM

## 2018-12-15 NOTE — Telephone Encounter (Signed)
Current Outpatient Medications   Medication Sig   . hydroCHLOROthiazide (HYDRODIURIL) 25 mg Oral Tablet Take 1 Tab (25 mg total) by mouth Once a day   . levothyroxine (SYNTHROID) 75 mcg Oral Tablet Take 1 Tab (75 mcg total) by mouth Every morning     Last dept visit:  01/07/2018  Next pending dept visit: 01/06/2019  Refill for levothyroxine pended to provider.  Inmer Nix, MA 12/15/2018, 08:55

## 2018-12-16 MED ORDER — LEVOTHYROXINE 75 MCG TABLET
75.00 ug | ORAL_TABLET | Freq: Every morning | ORAL | 3 refills | Status: DC
Start: 2018-12-16 — End: 2018-12-23

## 2018-12-16 NOTE — Telephone Encounter (Signed)
Left message on patient's VM that Dr. Maryjo Rochester would like her to get her TSH done.  There is an active order in chart.  Da Michelle, MA  12/16/2018, 12:38

## 2018-12-22 ENCOUNTER — Other Ambulatory Visit (HOSPITAL_BASED_OUTPATIENT_CLINIC_OR_DEPARTMENT_OTHER): Payer: Self-pay | Admitting: INTERNAL MEDICINE-ENDOCRINOLOGY-DIABETES AND METABOLISM

## 2018-12-22 ENCOUNTER — Encounter (HOSPITAL_BASED_OUTPATIENT_CLINIC_OR_DEPARTMENT_OTHER): Payer: Self-pay | Admitting: INTERNAL MEDICINE-ENDOCRINOLOGY-DIABETES AND METABOLISM

## 2018-12-23 NOTE — Telephone Encounter (Signed)
Current Outpatient Medications   Medication Sig   . hydroCHLOROthiazide (HYDRODIURIL) 25 mg Oral Tablet Take 1 Tab (25 mg total) by mouth Once a day   . levothyroxine (SYNTHROID) 75 mcg Oral Tablet Take 1 Tab (75 mcg total) by mouth Every morning     Last dept visit:  01/07/2018  Next pending dept visit: 01/06/2019  Refill for Levothyroxine pended to provider.  Yassmin Binegar, MA 12/23/2018, 11:33

## 2018-12-24 ENCOUNTER — Telehealth (HOSPITAL_BASED_OUTPATIENT_CLINIC_OR_DEPARTMENT_OTHER): Payer: Self-pay | Admitting: INTERNAL MEDICINE-ENDOCRINOLOGY-DIABETES AND METABOLISM

## 2018-12-24 NOTE — Telephone Encounter (Signed)
E- scribe for Euthyrox failed twice to [facility] [place], [place].  Phoned script in.  Euthyrox 39mcgs, one po daily.  90 with 0 refills. Liam Cammarata, MA  12/24/2018, 09:30

## 2018-12-29 LAB — ENTER/EDIT EXTERNAL COMMON LAB RESULTS: TSH: 9.09 — ABNORMAL HIGH

## 2019-01-06 ENCOUNTER — Telehealth (HOSPITAL_BASED_OUTPATIENT_CLINIC_OR_DEPARTMENT_OTHER): Payer: Self-pay | Admitting: INTERNAL MEDICINE-ENDOCRINOLOGY-DIABETES AND METABOLISM

## 2019-01-06 ENCOUNTER — Other Ambulatory Visit: Payer: Self-pay

## 2019-01-06 ENCOUNTER — Ambulatory Visit
Payer: Medicare Other | Attending: INTERNAL MEDICINE-ENDOCRINOLOGY-DIABETES AND METABOLISM | Admitting: INTERNAL MEDICINE-ENDOCRINOLOGY-DIABETES AND METABOLISM

## 2019-01-06 DIAGNOSIS — M8580 Other specified disorders of bone density and structure, unspecified site: Secondary | ICD-10-CM

## 2019-01-06 DIAGNOSIS — E042 Nontoxic multinodular goiter: Secondary | ICD-10-CM | POA: Insufficient documentation

## 2019-01-06 DIAGNOSIS — E039 Hypothyroidism, unspecified: Secondary | ICD-10-CM

## 2019-01-06 NOTE — Telephone Encounter (Signed)
Labs received from [facility] from 12/29/18. Placed in providers box for review.  Myrtha Mantis, MA  01/06/2019, 14:06

## 2019-01-06 NOTE — Progress Notes (Addendum)
TELEMEDICINE DOCUMENTATION DURING COVID 19:  Patient was located in their home:  [place] [id number]  Patient/family aware of provider location: Yes  Patient/family consent for telemedicine: Yes  Examination observed and performed by:  Dr. Cleatrice Burke     Endocrinology Progress Note:    CC:   Toxic Multi-Nodular Goiter s/p RAI ablation now treated with LT4   HPI:   Penny Weber is a 68 y.o.  female who presents today for f/u evaluation and management of toxic multi- nodular goiter s/p RAI tx 10/2016 and now treated for post-ablative hypothyroidism.. Additional PMH to include Vitamin D deficiency, Low Bone Density    From review of EPIC, appears that she saw Dr. Gabriel Rainwater back in 08/2008 for this. Hx also includes FNA of right sided thyroid nodule in 1999 that was reported benign.     Per review of EPIC,her TSH has always been slightly at or below normal. On October 24, 2016 she was treated with radioactive iodine therapy 20.5 mCi    Today she returns for follow-up  She is staying in [place] temporarily  and near family   Denies any neck symptoms including difficulty swallowing, dysphagia, change in vocal tone. Denies symptoms of hyperthyroidism including heat intolerance, weight loss, diarrhea, sweating. Denies symptoms of hypothyroidism cold intolerance, chills, constipation, and weight gain.    No bone fractures reported.   Last DEXA scan reportedly with low bone density s/p total hysterectomy for fibroid. Taking   1200 mg of calcium daily, 2,000 IU VIt D daily, Two serving of calcium in diet      Mother had thyroid dx      Review of Systems:   Signs/Symptoms Yes No Comments   Hair/Skin/Nail Changes  x    Compressive Symptoms  x    Fatigue  x    Weakness   x    Edema  x    Weight Changes  x    Menstrual Irregularities  x hysterectomy   Bowel Changes  x    Heat/Cold Intolerance  x    Palpitations  x    Tremors  x    Neck Tenderness  x    Increased Neck Size  x    Diplopia  x    Scleral  Injection  x    Proptosis  x    Dry Eyes  x      No chest pain.  No shortness of breath.  No headaches.  No lightheadedness or dizziness.  No GI complaints. No diarrhea or constipation.  No nausea or vomiting.  No fever or chills.  No polyuria or polydipsia.  No change in eating patterns.  No change in sleep patterns.      All other systems negative unless otherwise noted in HPI and problem list.         Past Medical History   Diagnosis Date   . Multinodular goiter    . Hypertension    osteopenia    Past Surgical History:   Procedure Laterality Date   . HX BREAST BIOPSY     . PB SUPRACERV ABD HYSTERECTOMY       No Known Allergies    Family History   Problem Relation Age of Onset   . Thyroid Disease Mother      hypothyroidism   . Thyroid Cancer       no thyroid cancer in family     Social History     Socioeconomic History   .  Marital status: Single     Spouse name: Not on file   . Number of children: Not on file   . Years of education: Not on file   . Highest education level: Not on file   Tobacco Use   . Smoking status: Never Smoker   . Smokeless tobacco: Never Used   Substance and Sexual Activity   . Drug use: No   Social History Narrative    Works at [employer], where she is driving frequently.     Current Outpatient Medications   Medication Sig   . EUTHYROX 75 mcg Oral Tablet Take 1 tablet by mouth once daily   . hydroCHLOROthiazide (HYDRODIURIL) 25 mg Oral Tablet Take 1 Tab (25 mg total) by mouth Once a day       ROS: No cp or nausea. All other systems were reviewed and negative except for as above.      Objective:    Limited exam, virtual encounter   Appearance:  Well appearing. No acute distress.  Psych:  Alert, awake, oriented x 3.    Eyes: No periorbital edema.  Skin: no visible rashes  Thyroid: no visible thyromegaly    Labs:  Lab Results   Component Value Date    TSH 9.090 (H) 12/29/2018    T3UP 37.2 09/25/2007    FREET4 1.18 01/06/2018        Component      Latest Ref Rng & Units 06/25/2016  06/25/2016 06/25/2016          10:58 AM 10:58 AM 10:58 AM   THYROID STIMULATING IMMUNOGLOBULIN (TSI), SERUM      <0.1 IU/L <0.1     THYROTROPIN RECEPTOR ANTIBODY, SERUM      0.00 - 1.75 IU/L   <1.00   THYROPEROXIDASE (TPO) ANTIBODIES, SERUM      <=51 IU/mL  <10      IMAGING:    I have personally reviewed her Thyroid Uptake/scan from 09/24/16 and Thy [place] from 09/25/16 which show hot nodule (s) located within the R thyroid lobe.     Penny Weber  Female, 68 years old.  NUC THYROID SCAN AND UPTAKE performed on 09/24/2016 9:03 AM.  COMPARISON:  June 02, 2002.  INDICATION:  E05.90: Hyperthyroidism  TECHNIQUE:  Radionuclide: 280 microcuries of I-123    Approximately 24 hours after the oral administration of iodine-123, a  thyroid scan and radioactive iodine uptake (RAIU) calculation was  performed.  Routine images of the thyroid were obtained.    FINDINGS: There is again asymmetric enlargement of the right thyroid lobe.  There is a large nodule involving the mid to lower portion of the right  thyroid lobe which demonstrates increased tracer uptake, associated with  mild suppression of uptake within the left thyroid lobe. This was also  present on the prior study. The calculated 24-hour RAIU is 28.2%, which is  within normal limits (normal 10-30%).    IMPRESSION:  1.  Right hyperfunctioning thyroid adenoma.  2.  Normal RAIU.      US THYROID performed on 09/25/2016 10:31 AM.  REASON FOR EXAM:  E05.90: Hyperthyroidism  Ultrasound imaging of the thyroid is compared to the previous ultrasound of  December 16, 2012, and with the nuclear medicine I-123 scans of September 24, 2016 and June 02, 2002.    Today's examination shows the right lobe is 5.9 cm in length by 2.1 cm AP  by 2.3 cm transverse. Isthmus is 1.9 mm in thickness. The left  lobe is 4.8  x 0.9 x 1.5 cm. The size of the thyroid is unchanged.    Multiple nodules are again identified throughout the right lobe similar to  the 2014 ultrasound.  An anterior upper  lobe a solid nodule is 8.7 mm, and a small posterior  upper pole nodule is 5.4 mm.  A posterior upper pole nodule is 9 mm as seen on image 29.  In the anterior mid right lobe a nodule containing echogenic foci likely  calcifications is 12.1 x 7 x 11 mm, image 43, and is stable as it was 12.5  x 6.3 x 10 mm.  In the anterior lower pole a cyst nodule is 7.7 mm, image 56 and it was 9.9  mm.  In the posterior mid and lower right lobe a dominant nodule is 23.2 x 16.4  x 13.7 mm as seen on image 92. Previously it measured 30.7 x 13.8 x 17.8 mm  as seen on the prior study image 30.    The 2 prior nuclear medicine scan showed a hyperactive nodule within the  mid to lower right lobe.    In the left lobe a tiny colloid cyst at the upper pole is 3 mm and a second  at the midportion is 2.2 mm. At the lower pole a similar colloid nodule is  3.2 mm.      IMPRESSION:  No change in the size nor appearance of the thyroid.    Multiple nodules are again identified throughout the right lobe as  discussed above with the largest at the posterior mid to lower right lobe  measuring 23 mm and it was 30 mm. This appears to be the nodule that showed  increased activity on the 2004 and 2018 nuclear medicine scans. Please  correlate with the clinical data. If there are persistent concerns one  could consider fine-needle aspiration.    Assessment/ Plan:  Penny Weber is a pleasant 68 y.o. female here for evaluation and management of multiple thyroid nodules with R sided thyroid nodules which were hyperfunctioning. She is now s/p radioiodine therapy and on LT4 therapy    1. Toxic multinodular goiter, post-ablative hypothyroidism   Recommended tx of hyperthyroid state due to hx of low bone density and risks of CV disease and subclinical hyperthyroidism  Status post radioactive iodine therapy October 24, 2016  TSH elevated and uncontrolled today   Currently on 75 mcg LT4. Increase to take extra tablet on sundays and recheck levels in 6  weeks time     2.  Osteopenia  Continue on vitamin d supplementation and calcium.    Discussed with patient to call in to [place] a week after lab tests and/or imaging are done if he/she has not heard from [place].  Answered all patient's questions.      Izetta Dakin, DO   Clinical Assistant Professor   Department of Endocrinology   22 minute encounter 50% counseling patient

## 2019-01-08 ENCOUNTER — Telehealth (HOSPITAL_BASED_OUTPATIENT_CLINIC_OR_DEPARTMENT_OTHER): Payer: Self-pay | Admitting: INTERNAL MEDICINE-ENDOCRINOLOGY-DIABETES AND METABOLISM

## 2019-01-08 ENCOUNTER — Other Ambulatory Visit (HOSPITAL_BASED_OUTPATIENT_CLINIC_OR_DEPARTMENT_OTHER): Payer: Self-pay | Admitting: INTERNAL MEDICINE-ENDOCRINOLOGY-DIABETES AND METABOLISM

## 2019-01-08 DIAGNOSIS — E039 Hypothyroidism, unspecified: Secondary | ICD-10-CM

## 2019-01-08 MED ORDER — LEVOTHYROXINE 75 MCG TABLET
75.00 ug | ORAL_TABLET | Freq: Every morning | ORAL | 3 refills | Status: DC
Start: 2019-01-08 — End: 2020-02-15

## 2019-01-08 NOTE — Telephone Encounter (Signed)
-----   Message from Cleatrice Burke, DO sent at 01/08/2019  7:41 AM EDT -----  Hi ladies,  She will need labs faxed to where she is staying in [place]. Thk yoU!

## 2019-01-08 NOTE — Telephone Encounter (Signed)
Mailed lab orders to address on file per patient. Elihu Milstein, RN  01/08/2019, 08:14

## 2019-03-08 LAB — ENTER/EDIT EXTERNAL COMMON LAB RESULTS: TSH: 1.39

## 2019-03-12 ENCOUNTER — Encounter (HOSPITAL_BASED_OUTPATIENT_CLINIC_OR_DEPARTMENT_OTHER): Payer: Self-pay | Admitting: INTERNAL MEDICINE-ENDOCRINOLOGY-DIABETES AND METABOLISM

## 2019-03-15 ENCOUNTER — Telehealth (HOSPITAL_BASED_OUTPATIENT_CLINIC_OR_DEPARTMENT_OTHER): Payer: Self-pay | Admitting: INTERNAL MEDICINE-ENDOCRINOLOGY-DIABETES AND METABOLISM

## 2019-03-15 NOTE — Telephone Encounter (Signed)
Received external labs from [facility], entered and placed in provider's box for review. Dayonna Selbe, RN  03/15/2019, 10:17

## 2020-01-15 ENCOUNTER — Encounter (HOSPITAL_BASED_OUTPATIENT_CLINIC_OR_DEPARTMENT_OTHER): Payer: Self-pay | Admitting: INTERNAL MEDICINE-ENDOCRINOLOGY-DIABETES AND METABOLISM

## 2020-01-18 ENCOUNTER — Encounter (HOSPITAL_BASED_OUTPATIENT_CLINIC_OR_DEPARTMENT_OTHER): Payer: Self-pay | Admitting: INTERNAL MEDICINE-ENDOCRINOLOGY-DIABETES AND METABOLISM

## 2020-02-02 LAB — ENTER/EDIT EXTERNAL COMMON LAB RESULTS
25-HYDROXY VITAMIN D: 37
ALBUMIN (SERUM): 4.3
ALKALINE PHOSPHATASE: 99
ALT (SGPT): 11
AST (SGOT): 19
BUN: 20
CALCIUM: 10.1
CARBON DIOXIDE: 32
CHLORIDE: 104
CHOLESTEROL: 194
CREATININE: 0.72
EGFR IF AFRICN AM: 99 mL/min/{1.73_m2}
EGFR IF NONAFRICN AM: 85 mL/min/{1.73_m2}
FOLATE: 11.4
GLUCOSE, FASTING: 99
HCT: 42.4
HDL-CHOLESTEROL: 59
HEMOGLOBIN A1C: 5.3
HGB: 14.1
LDL CHOLESTEROL,DIRECT: 120 — ABNORMAL HIGH
PLATELET COUNT: 135 — ABNORMAL LOW
POTASSIUM: 4.2
SODIUM: 141
THYROXINE, TOTAL T4: 9.6
TRIGLYCERIDES: 59
TSH: 1.23
VITAMIN B12: 241
WBC: 5.4

## 2020-02-10 NOTE — Telephone Encounter (Signed)
Received external labs from [facility], entered and placed in provider's box for review. Pandora Mccrackin, RN  02/10/2020, 08:02

## 2020-02-15 MED ORDER — LEVOTHYROXINE 75 MCG TABLET
75.0000 ug | ORAL_TABLET | Freq: Every morning | ORAL | 0 refills | Status: AC
Start: 2020-02-15 — End: ?

## 2020-02-15 NOTE — Telephone Encounter (Signed)
Current Outpatient Medications   Medication Sig   . hydroCHLOROthiazide (HYDRODIURIL) 25 mg Oral Tablet Take 1 Tab (25 mg total) by mouth Once a day   . levothyroxine (EUTHYROX) 75 mcg Oral Tablet Take 1 Tab (75 mcg total) by mouth Every morning     Last dept visit: 01/06/2019  Next pending dept visit: Visit date not found  Pending [facility] short term supply. Sent MyChart to schedule  Christie Nottingham, RN 02/15/2020, 09:25

## 2020-12-17 ENCOUNTER — Other Ambulatory Visit: Payer: Self-pay
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
